# Patient Record
Sex: Female | Born: 1960
Health system: Southern US, Community
[De-identification: ages and names within clinical notes are randomized; demographics above are authoritative.]

## PROBLEM LIST (undated history)

## (undated) DIAGNOSIS — I10 Essential (primary) hypertension: Secondary | ICD-10-CM

## (undated) DIAGNOSIS — R87619 Unspecified abnormal cytological findings in specimens from cervix uteri: Secondary | ICD-10-CM

## (undated) DIAGNOSIS — F419 Anxiety disorder, unspecified: Secondary | ICD-10-CM

## (undated) DIAGNOSIS — D219 Benign neoplasm of connective and other soft tissue, unspecified: Secondary | ICD-10-CM

## (undated) DIAGNOSIS — IMO0002 Reserved for concepts with insufficient information to code with codable children: Secondary | ICD-10-CM

## (undated) DIAGNOSIS — E785 Hyperlipidemia, unspecified: Secondary | ICD-10-CM

## (undated) HISTORY — DX: Essential (primary) hypertension: I10

## (undated) HISTORY — DX: Anxiety disorder, unspecified: F41.9

## (undated) HISTORY — DX: Benign neoplasm of connective and other soft tissue, unspecified: D21.9

## (undated) HISTORY — PX: TONSILLECTOMY: SUR1361

## (undated) HISTORY — DX: Unspecified abnormal cytological findings in specimens from cervix uteri: R87.619

## (undated) HISTORY — DX: Reserved for concepts with insufficient information to code with codable children: IMO0002

## (undated) HISTORY — DX: Hyperlipidemia, unspecified: E78.5

---

## 1990-03-04 HISTORY — PX: DILATION AND CURETTAGE OF UTERUS: SHX78

## 1999-06-27 ENCOUNTER — Other Ambulatory Visit: Admission: RE | Admit: 1999-06-27 | Discharge: 1999-06-27 | Payer: Self-pay | Admitting: Family Medicine

## 1999-07-26 ENCOUNTER — Encounter (INDEPENDENT_AMBULATORY_CARE_PROVIDER_SITE_OTHER): Payer: Self-pay | Admitting: Specialist

## 1999-07-26 ENCOUNTER — Other Ambulatory Visit: Admission: RE | Admit: 1999-07-26 | Discharge: 1999-07-26 | Payer: Self-pay | Admitting: Obstetrics and Gynecology

## 2000-01-07 ENCOUNTER — Other Ambulatory Visit: Admission: RE | Admit: 2000-01-07 | Discharge: 2000-01-07 | Payer: Self-pay | Admitting: Obstetrics and Gynecology

## 2000-06-09 ENCOUNTER — Other Ambulatory Visit: Admission: RE | Admit: 2000-06-09 | Discharge: 2000-06-09 | Payer: Self-pay | Admitting: Obstetrics and Gynecology

## 2000-12-15 ENCOUNTER — Other Ambulatory Visit: Admission: RE | Admit: 2000-12-15 | Discharge: 2000-12-15 | Payer: Self-pay | Admitting: Obstetrics and Gynecology

## 2001-07-10 ENCOUNTER — Other Ambulatory Visit: Admission: RE | Admit: 2001-07-10 | Discharge: 2001-07-10 | Payer: Self-pay | Admitting: Obstetrics and Gynecology

## 2001-10-05 ENCOUNTER — Emergency Department (HOSPITAL_COMMUNITY): Admission: EM | Admit: 2001-10-05 | Discharge: 2001-10-05 | Payer: Self-pay | Admitting: Emergency Medicine

## 2002-08-06 ENCOUNTER — Other Ambulatory Visit: Admission: RE | Admit: 2002-08-06 | Discharge: 2002-08-06 | Payer: Self-pay | Admitting: Obstetrics and Gynecology

## 2003-08-09 ENCOUNTER — Other Ambulatory Visit: Admission: RE | Admit: 2003-08-09 | Discharge: 2003-08-09 | Payer: Self-pay | Admitting: Obstetrics and Gynecology

## 2004-03-16 ENCOUNTER — Other Ambulatory Visit: Admission: RE | Admit: 2004-03-16 | Discharge: 2004-03-16 | Payer: Self-pay | Admitting: Obstetrics and Gynecology

## 2004-09-03 ENCOUNTER — Other Ambulatory Visit: Admission: RE | Admit: 2004-09-03 | Discharge: 2004-09-03 | Payer: Self-pay | Admitting: Obstetrics and Gynecology

## 2005-09-09 ENCOUNTER — Other Ambulatory Visit: Admission: RE | Admit: 2005-09-09 | Discharge: 2005-09-09 | Payer: Self-pay | Admitting: Obstetrics & Gynecology

## 2005-09-18 ENCOUNTER — Encounter: Admission: RE | Admit: 2005-09-18 | Discharge: 2005-09-18 | Payer: Self-pay | Admitting: Obstetrics & Gynecology

## 2005-09-30 ENCOUNTER — Encounter: Admission: RE | Admit: 2005-09-30 | Discharge: 2005-09-30 | Payer: Self-pay | Admitting: Obstetrics & Gynecology

## 2006-04-11 ENCOUNTER — Other Ambulatory Visit: Admission: RE | Admit: 2006-04-11 | Discharge: 2006-04-11 | Payer: Self-pay | Admitting: Obstetrics & Gynecology

## 2006-09-26 ENCOUNTER — Other Ambulatory Visit: Admission: RE | Admit: 2006-09-26 | Discharge: 2006-09-26 | Payer: Self-pay | Admitting: Obstetrics & Gynecology

## 2007-11-18 ENCOUNTER — Other Ambulatory Visit: Admission: RE | Admit: 2007-11-18 | Discharge: 2007-11-18 | Payer: Self-pay | Admitting: Obstetrics & Gynecology

## 2009-12-22 ENCOUNTER — Ambulatory Visit (HOSPITAL_COMMUNITY): Admission: RE | Admit: 2009-12-22 | Discharge: 2009-12-22 | Payer: Self-pay | Admitting: Obstetrics & Gynecology

## 2010-02-01 HISTORY — PX: LEEP: SHX91

## 2010-03-25 ENCOUNTER — Encounter: Payer: Self-pay | Admitting: Obstetrics & Gynecology

## 2010-04-02 ENCOUNTER — Other Ambulatory Visit (HOSPITAL_COMMUNITY): Payer: Self-pay | Admitting: Maternal and Fetal Medicine

## 2010-04-02 DIAGNOSIS — O10919 Unspecified pre-existing hypertension complicating pregnancy, unspecified trimester: Secondary | ICD-10-CM

## 2010-04-03 ENCOUNTER — Other Ambulatory Visit (HOSPITAL_COMMUNITY): Payer: Self-pay | Admitting: Obstetrics and Gynecology

## 2010-04-03 ENCOUNTER — Other Ambulatory Visit (HOSPITAL_COMMUNITY): Payer: Self-pay | Admitting: Maternal and Fetal Medicine

## 2010-04-03 DIAGNOSIS — O09529 Supervision of elderly multigravida, unspecified trimester: Secondary | ICD-10-CM

## 2010-04-03 DIAGNOSIS — O2690 Pregnancy related conditions, unspecified, unspecified trimester: Secondary | ICD-10-CM

## 2012-06-23 ENCOUNTER — Telehealth: Payer: Self-pay | Admitting: *Deleted

## 2012-06-23 NOTE — Telephone Encounter (Signed)
Patient last annual was 04/22/11 requesting refill of Lisinopril-HCTZ 20-25 mg has a alert in chart to follow up for 6 month PAP originally scheduled for 10/16/11 ok to refill?

## 2012-06-23 NOTE — Telephone Encounter (Signed)
Can have refill for one month but must come in for Pap

## 2012-06-24 ENCOUNTER — Other Ambulatory Visit: Payer: Self-pay | Admitting: *Deleted

## 2012-06-24 MED ORDER — LISINOPRIL-HYDROCHLOROTHIAZIDE 20-25 MG PO TABS: 1.0000 | ORAL_TABLET | Freq: Every day | ORAL | Status: DC

## 2012-06-30 NOTE — Telephone Encounter (Signed)
This is fine 

## 2012-06-30 NOTE — Telephone Encounter (Signed)
Dr Hyacinth Meeker, I left her a message that we need to schedule her AEX/follow up pap  She is aware it is late but wanted to wait until you came back.. She is on the schedule for 07/31/12. Is this ok or do I need to try to get her in sooner?

## 2012-07-31 ENCOUNTER — Ambulatory Visit: Payer: Self-pay | Admitting: Obstetrics & Gynecology

## 2012-08-19 ENCOUNTER — Telehealth: Payer: Self-pay | Admitting: Obstetrics & Gynecology

## 2012-08-19 NOTE — Telephone Encounter (Signed)
Called patient to ask if she can come in sooner as she is overdue for her for her annual.

## 2012-08-19 NOTE — Telephone Encounter (Signed)
Patient is calling ask the Dr. If she will extend her Rx for her Blood Pressure meds. She has 5 left. Her appointment is not until Sept. 2, 2014 . Was last filled on April 23,0214

## 2012-08-20 NOTE — Telephone Encounter (Signed)
Patient appointment for AEX moved up//kn

## 2012-08-21 ENCOUNTER — Encounter: Payer: Self-pay | Admitting: Obstetrics & Gynecology

## 2012-08-24 ENCOUNTER — Ambulatory Visit (INDEPENDENT_AMBULATORY_CARE_PROVIDER_SITE_OTHER): Payer: 59 | Admitting: Obstetrics & Gynecology

## 2012-08-24 ENCOUNTER — Other Ambulatory Visit: Payer: Self-pay | Admitting: Obstetrics & Gynecology

## 2012-08-24 ENCOUNTER — Encounter: Payer: Self-pay | Admitting: Obstetrics & Gynecology

## 2012-08-24 VITALS — BP 146/82 | HR 60 | Resp 16 | Ht 65.25 in | Wt 161.8 lb

## 2012-08-24 DIAGNOSIS — Z1231 Encounter for screening mammogram for malignant neoplasm of breast: Secondary | ICD-10-CM

## 2012-08-24 DIAGNOSIS — Z124 Encounter for screening for malignant neoplasm of cervix: Secondary | ICD-10-CM

## 2012-08-24 DIAGNOSIS — Z0000 Encounter for general adult medical examination without abnormal findings: Secondary | ICD-10-CM

## 2012-08-24 DIAGNOSIS — Z01419 Encounter for gynecological examination (general) (routine) without abnormal findings: Secondary | ICD-10-CM

## 2012-08-24 LAB — POCT URINALYSIS DIPSTICK
Bilirubin, UA: NEGATIVE
Blood, UA: NEGATIVE
Glucose, UA: NEGATIVE
Ketones, UA: NEGATIVE
Nitrite, UA: NEGATIVE
pH, UA: 5

## 2012-08-24 LAB — LIPID PANEL
Cholesterol: 245 mg/dL — ABNORMAL HIGH (ref 0–200)
HDL: 58 mg/dL (ref >39)
LDL Cholesterol: 162 mg/dL — ABNORMAL HIGH (ref 0–99)
Total CHOL/HDL Ratio: 4.2 Ratio
Triglycerides: 125 mg/dL (ref <150)
VLDL: 25 mg/dL (ref 0–40)

## 2012-08-24 LAB — COMPREHENSIVE METABOLIC PANEL
AST: 20 U/L (ref 0–37)
Albumin: 4.7 g/dL (ref 3.5–5.2)
Alkaline Phosphatase: 63 U/L (ref 39–117)
BUN: 20 mg/dL (ref 6–23)
Creat: 0.84 mg/dL (ref 0.50–1.10)
Glucose, Bld: 87 mg/dL (ref 70–99)
Potassium: 3.9 mEq/L (ref 3.5–5.3)

## 2012-08-24 LAB — TSH: TSH: 1.467 u[IU]/mL (ref 0.350–4.500)

## 2012-08-24 MED ORDER — LISINOPRIL-HYDROCHLOROTHIAZIDE 20-25 MG PO TABS: 1.0000 | ORAL_TABLET | Freq: Every day | ORAL | Status: DC

## 2012-08-24 NOTE — Progress Notes (Signed)
52 y.o. G3P2 MarriedCaucasianF here for annual exam.  Daughter is now in [facility] and expecting second grandchild.  Oldest is 3--a boy and she is expecting a little girl.  Since IUD placement, patient having very little bleeding.  Has spotted twice since then.  Having night sweats.  PCP is retiring--Dr. Gerri Spore.  Lost ~15 pounds since last year.  Has been working on this.        Sexually active: yes  The current method of family planning is IUD, placed 04/23/11 Exercising: no  not regularly Smoker:  no  Health Maintenance: Pap:  04/22/11 WNL History of abnormal Pap:  Yes h/o CIN I, CIN II/LEEP 12/11 negative margins MMG:  12/22/09 normal  Colonoscopy:  none BMD:   none TDaP:  3/09 Screening Labs: today, Hb today: today, Urine today: WBC-2+, PROTEIN-trace   reports that she has never smoked. She has never used smokeless tobacco. She reports that she does not drink alcohol or use illicit drugs.  Past Medical History  Diagnosis Date  . Hypertension   . Abnormal Pap smear     h/o LEEP 12/11  . Fibroids     Past Surgical History  Procedure Laterality Date  . Tonsillectomy    . Dilation and curettage of uterus  1992    MAB  . Leep  12/11    negative margins    Current Outpatient Prescriptions  Medication Sig Dispense Refill  . levonorgestrel (MIRENA) 20 MCG/24HR IUD 1 each by Intrauterine route once.      Marland Kitchen lisinopril-hydrochlorothiazide (PRINZIDE,ZESTORETIC) 20-25 MG per tablet Take 1 tablet by mouth daily.  30 tablet  0   No current facility-administered medications for this visit.    Family History  Problem Relation Age of Onset  . Hypertension Mother   . Hypertension Father   . Hypertension Sister   . Diabetes Maternal Grandfather   . Heart attack Paternal Grandmother     and strokes  . Heart disease Paternal Grandfather     ROS:  Pertinent items are noted in HPI.  Otherwise, a comprehensive ROS was negative.  Exam:   BP 146/82  Pulse 60  Resp 16  Ht 5'  5.25" (1.657 m)  Wt 161 lb 12.8 oz (73.392 kg)  BMI 26.73 kg/m2  LMP 04/02/2012  Weight change: -15lbs Height: 5' 5.25" (165.7 cm)  Ht Readings from Last 3 Encounters:  08/24/12 5' 5.25" (1.657 m)    General appearance: alert, cooperative and appears stated age Head: Normocephalic, without obvious abnormality, atraumatic Neck: no adenopathy, supple, symmetrical, trachea midline and thyroid normal to inspection and palpation Lungs: clear to auscultation bilaterally Breasts: normal appearance, no masses or tenderness Heart: regular rate and rhythm Abdomen: soft, non-tender; bowel sounds normal; no masses,  no organomegaly Extremities: extremities normal, atraumatic, no cyanosis or edema Skin: Skin color, texture, turgor normal. No rashes or lesions Lymph nodes: Cervical, supraclavicular, and axillary nodes normal. No abnormal inguinal nodes palpated Neurologic: Grossly normal   Pelvic: External genitalia:  no lesions              Urethra:  normal appearing urethra with no masses, tenderness or lesions              Bartholins and Skenes: normal                 Vagina: normal appearing vagina with normal color and discharge, no lesions              Cervix: no lesions  and s/p LEEP, no IUD string noted.              Pap taken: yes Bimanual Exam:  Uterus:  normal size, contour, position, consistency, mobility, non-tender, much smaller              Adnexa: normal adnexa and no mass, fullness, tenderness               Rectovaginal: Confirms               Anus:  normal sphincter tone, no lesions  A:  Well Woman with normal exam H/O LEEP.  H/O CIN II with neg margins. H/O uterine fibroids and menorrhagia S/P Mirena placement 2/13.  No string seen but this was the case at 6 week f/u.  U/S showed normal placement.  P:   Mammogram due.  We will schedule for pt--July 2 @ 12:00 RF on Lisinopril-HCTZ to pharmacy FLP, CMP, Vit D, TSH pap smear yearly Referral for colnoscopy return annually  or prn  An After Visit Summary was printed and given to the patient.

## 2012-08-24 NOTE — Patient Instructions (Signed)

## 2012-08-25 ENCOUNTER — Telehealth: Payer: Self-pay | Admitting: Orthopedic Surgery

## 2012-08-25 ENCOUNTER — Encounter: Payer: Self-pay | Admitting: Internal Medicine

## 2012-08-25 LAB — HEMOGLOBIN, FINGERSTICK: Hemoglobin, fingerstick: 13 g/dL (ref 12.0–16.0)

## 2012-08-25 NOTE — Telephone Encounter (Signed)
LMTCB re: appt. aa    (Need to tell her about appt for colonoscopy consult with Dr. Diamond Nickel 10-16-12 at 9:00. Procedure appt 10-30-12 at 10:00. [facility] [address] Phone [phone] to resched.)

## 2012-08-27 LAB — IPS PAP TEST WITH REFLEX TO HPV

## 2012-09-02 ENCOUNTER — Ambulatory Visit (HOSPITAL_COMMUNITY)
Admission: RE | Admit: 2012-09-02 | Discharge: 2012-09-02 | Disposition: A | Discharge status: Home / Self Care | Payer: 59 | Source: Ambulatory Visit | Attending: Obstetrics & Gynecology | Admitting: Obstetrics & Gynecology

## 2012-09-02 DIAGNOSIS — Z1231 Encounter for screening mammogram for malignant neoplasm of breast: Secondary | ICD-10-CM | POA: Insufficient documentation

## 2012-09-03 ENCOUNTER — Ambulatory Visit: Payer: Self-pay | Admitting: Obstetrics & Gynecology

## 2012-10-16 ENCOUNTER — Ambulatory Visit (AMBULATORY_SURGERY_CENTER): Payer: 59 | Admitting: *Deleted

## 2012-10-16 VITALS — Ht 65.0 in | Wt 167.4 lb

## 2012-10-16 DIAGNOSIS — Z1211 Encounter for screening for malignant neoplasm of colon: Secondary | ICD-10-CM

## 2012-10-16 MED ORDER — MOVIPREP 100 G PO SOLR: ORAL | Status: DC

## 2012-10-16 NOTE — Progress Notes (Signed)
No allergies to eggs or soy. No problems with anesthesia.  

## 2012-10-30 ENCOUNTER — Ambulatory Visit (AMBULATORY_SURGERY_CENTER): Payer: 59 | Admitting: Internal Medicine

## 2012-10-30 ENCOUNTER — Encounter: Payer: Self-pay | Admitting: Internal Medicine

## 2012-10-30 VITALS — BP 110/71 | HR 66 | Temp 98.0°F | Resp 16 | Ht 65.0 in | Wt 167.0 lb

## 2012-10-30 DIAGNOSIS — D126 Benign neoplasm of colon, unspecified: Secondary | ICD-10-CM

## 2012-10-30 DIAGNOSIS — Z1211 Encounter for screening for malignant neoplasm of colon: Secondary | ICD-10-CM

## 2012-10-30 MED ORDER — SODIUM CHLORIDE 0.9 % IV SOLN: 500.0000 mL | INTRAVENOUS | Status: DC

## 2012-10-30 NOTE — Progress Notes (Signed)
Patient did not experience any of the following events: a burn prior to discharge; a fall within the facility; wrong site/side/patient/procedure/implant event; or a hospital transfer or hospital admission upon discharge from the facility. (G8907) Patient did not have preoperative order for IV antibiotic SSI prophylaxis. (G8918)  

## 2012-10-30 NOTE — Progress Notes (Signed)
Report to pacu rn, vss, bbs=clear 

## 2012-10-30 NOTE — Op Note (Signed)
[facility] [address] [place], [address]   COLONOSCOPY PROCEDURE REPORT  PATIENT: Darlene Shelton, Darlene Shelton  MR#: [id number] BIRTHDATE: Jul 28, 1960 , 51  yrs. old GENDER: Female ENDOSCOPIST: Hart Carwin, MD REFERRED [id number] Hyacinth Meeker, M.D. , [facility] PROCEDURE DATE:  10/30/2012 PROCEDURE:   Colonoscopy with snare polypectomy First Screening Colonoscopy - Avg.  risk and is 50 yrs.  old or older Yes.  Prior Negative Screening - Now for repeat screening. N/A  History of Adenoma - Now for follow-up colonoscopy & has been > or = to 3 yrs.  N/A  Polyps Removed Today? Yes. ASA CLASS:   Class I INDICATIONS:Average risk patient for colon cancer. MEDICATIONS: MAC sedation, administered by CRNA and propofol (Diprivan) 300mg  IV  DESCRIPTION OF PROCEDURE:   After the risks benefits and alternatives of the procedure were thoroughly explained, informed consent was obtained.  A digital rectal exam revealed no abnormalities of the rectum.   The LB [id number]  endoscope was introduced through the anus and advanced to the cecum, which was identified by both the appendix and ileocecal valve. No adverse events experienced.   The quality of the prep was excellent, using MoviPrep  The instrument was then slowly withdrawn as the colon was fully examined.      COLON FINDINGS: A polypoid shaped sessile polyp ranging between 3-93mm in size was found in the sigmoid colon.  A polypectomy was performed with a cold snare.  The resection was complete and the polyp tissue was completely retrieved.  Retroflexed views revealed no abnormalities. The time to cecum=9 minutes 16 seconds. Withdrawal time=8 minutes 26 seconds.  The scope was withdrawn and the procedure completed. COMPLICATIONS: There were no complications.  ENDOSCOPIC IMPRESSION: Sessile polyp ranging between 3-22mm in size was found in the sigmoid colon; polypectomy was performed with a cold  snare  RECOMMENDATIONS: Await pathology results high fiber diet recall pending path report   eSigned:  Hart Carwin, MD 10/30/2012 12:09 PM   cc:

## 2012-10-30 NOTE — Patient Instructions (Addendum)
1 polyp removed and sent to pathology.  Avoid aspirin or antiinflammatory products for 2 weeks (until 11/13/2012) High fiber diet recommended. Await pathology for final recommendations.  YOU HAD AN ENDOSCOPIC PROCEDURE TODAY AT THE [facility]: Refer to the procedure report that was given to you for any specific questions about what was found during the examination.  If the procedure report does not answer your questions, please call your gastroenterologist to clarify.  If you requested that your care partner not be given the details of your procedure findings, then the procedure report has been included in a sealed envelope for you to review at your convenience later.  YOU SHOULD EXPECT: Some feelings of bloating in the abdomen. Passage of more gas than usual.  Walking can help get rid of the air that was put into your GI tract during the procedure and reduce the bloating. If you had a lower endoscopy (such as a colonoscopy or flexible sigmoidoscopy) you may notice spotting of blood in your stool or on the toilet paper. If you underwent a bowel prep for your procedure, then you may not have a normal bowel movement for a few days.  DIET: Your first meal following the procedure should be a light meal and then it is ok to progress to your normal diet.  A half-sandwich or bowl of soup is an example of a good first meal.  Heavy or fried foods are harder to digest and may make you feel nauseous or bloated.  Likewise meals heavy in dairy and vegetables can cause extra gas to form and this can also increase the bloating.  Drink plenty of fluids but you should avoid alcoholic beverages for 24 hours.  ACTIVITY: Your care partner should take you home directly after the procedure.  You should plan to take it easy, moving slowly for the rest of the day.  You can resume normal activity the day after the procedure however you should NOT DRIVE or use heavy machinery for 24 hours (because of the sedation  medicines used during the test).    SYMPTOMS TO REPORT IMMEDIATELY: A gastroenterologist can be reached at any hour.  During normal business hours, 8:30 AM to 5:00 PM Monday through Friday, call [phone].  After hours and on weekends, please call the GI answering service at [phone] who will take a message and have the physician on call contact you.   Following lower endoscopy (colonoscopy or flexible sigmoidoscopy):  Excessive amounts of blood in the stool  Significant tenderness or worsening of abdominal pains  Swelling of the abdomen that is new, acute  Fever of 100F or higher  FOLLOW UP: If any biopsies were taken you will be contacted by phone or by letter within the next 1-3 weeks.  Call your gastroenterologist if you have not heard about the biopsies in 3 weeks.  Our staff will call the home number listed on your records the next business day following your procedure to check on you and address any questions or concerns that you may have at that time regarding the information given to you following your procedure. This is a courtesy call and so if there is no answer at the home number and we have not heard from you through the emergency physician on call, we will assume that you have returned to your regular daily activities without incident.  SIGNATURES/CONFIDENTIALITY: You and/or your care partner have signed paperwork which will be entered into your electronic medical record.  These signatures  attest to the fact that that the information above on your After Visit Summary has been reviewed and is understood.  Full responsibility of the confidentiality of this discharge information lies with you and/or your care-partner.

## 2012-10-30 NOTE — Progress Notes (Signed)
Called to room to assist during endoscopic procedure.  Patient ID and intended procedure confirmed with present staff. Received instructions for my participation in the procedure from the performing physician.  

## 2012-11-03 ENCOUNTER — Telehealth: Payer: Self-pay

## 2012-11-03 ENCOUNTER — Ambulatory Visit: Payer: Self-pay | Admitting: Obstetrics & Gynecology

## 2012-11-03 NOTE — Telephone Encounter (Signed)
Left message on answering machine. 

## 2012-11-05 ENCOUNTER — Encounter: Payer: Self-pay | Admitting: Internal Medicine

## 2013-01-07 ENCOUNTER — Other Ambulatory Visit: Payer: Self-pay

## 2013-07-14 ENCOUNTER — Encounter: Payer: Self-pay | Admitting: Obstetrics & Gynecology

## 2013-09-08 ENCOUNTER — Ambulatory Visit: Payer: 59 | Admitting: Obstetrics & Gynecology

## 2013-09-24 ENCOUNTER — Ambulatory Visit: Payer: 59 | Admitting: Obstetrics & Gynecology

## 2013-10-04 ENCOUNTER — Ambulatory Visit (INDEPENDENT_AMBULATORY_CARE_PROVIDER_SITE_OTHER): Payer: 59 | Admitting: Obstetrics & Gynecology

## 2013-10-04 ENCOUNTER — Encounter: Payer: Self-pay | Admitting: Obstetrics & Gynecology

## 2013-10-04 VITALS — BP 122/78 | HR 68 | Resp 16 | Ht 65.0 in | Wt 169.4 lb

## 2013-10-04 DIAGNOSIS — Z01419 Encounter for gynecological examination (general) (routine) without abnormal findings: Secondary | ICD-10-CM

## 2013-10-04 DIAGNOSIS — Z0000 Encounter for general adult medical examination without abnormal findings: Secondary | ICD-10-CM

## 2013-10-04 LAB — POCT URINALYSIS DIPSTICK
Bilirubin, UA: NEGATIVE
GLUCOSE UA: NEGATIVE
Nitrite, UA: NEGATIVE
PROTEIN UA: NEGATIVE
RBC UA: NEGATIVE
UROBILINOGEN UA: NEGATIVE
pH, UA: 5

## 2013-10-04 MED ORDER — LISINOPRIL-HYDROCHLOROTHIAZIDE 20-25 MG PO TABS: 1.0000 | ORAL_TABLET | Freq: Every day | ORAL | Status: DC

## 2013-10-04 NOTE — Progress Notes (Signed)
53 y.o. G3P2 MarriedCaucasianF here for annual exam.  Has two grandchildren in [place].  Daughter lives there.  Pt goes there frequently to visit.  Denies VB.     Patient's last menstrual period was 04/05/2011.          Sexually active: Yes.    The current method of family planning is IUD.  Placed 2/13. Exercising: Yes.    walking Smoker:  no  Health Maintenance: Pap:  08/24/12 ASCUS/negative HR HPV History of abnormal Pap:  yes MMG:  09/02/12-normal Colonoscopy:  2014-repeat in 10 years, one polyp BMD:   none TDaP:  3/09 Screening Labs: today, Hb today: today, Urine today: WBC-1+, KETONE-trace   reports that she has never smoked. She has never used smokeless tobacco. She reports that she does not drink alcohol or use illicit drugs.  Past Medical History  Diagnosis Date  . Hypertension   . Abnormal Pap smear     h/o LEEP 12/11  . Fibroids     Past Surgical History  Procedure Laterality Date  . Tonsillectomy    . Dilation and curettage of uterus  1992    MAB  . Leep  12/11    negative margins    Current Outpatient Prescriptions  Medication Sig Dispense Refill  . levonorgestrel (MIRENA) 20 MCG/24HR IUD 1 each by Intrauterine route once.      Marland Kitchen lisinopril-hydrochlorothiazide (PRINZIDE,ZESTORETIC) 20-25 MG per tablet Take 1 tablet by mouth daily.  90 tablet  4   No current facility-administered medications for this visit.    Family History  Problem Relation Age of Onset  . Hypertension Mother   . Hypertension Father   . Hypertension Sister   . Diabetes Maternal Grandfather   . Heart attack Paternal Grandmother     and strokes  . Heart disease Paternal Grandfather   . Colon cancer Neg Hx     ROS:  Pertinent items are noted in HPI.  Otherwise, a comprehensive ROS was negative.  Exam:   BP 122/78  Pulse 68  Resp 16  Ht 5\' 5"  (1.651 m)  Wt 169 lb 6.4 oz (76.839 kg)  BMI 28.19 kg/m2  LMP 04/05/2011  Weight change: +8#  Height: 5\' 5"  (165.1 cm)  Ht Readings  from Last 3 Encounters:  10/04/13 5\' 5"  (1.651 m)  10/30/12 5\' 5"  (1.651 m)  10/16/12 5\' 5"  (1.651 m)    General appearance: alert, cooperative and appears stated age Head: Normocephalic, without obvious abnormality, atraumatic Neck: no adenopathy, supple, symmetrical, trachea midline and thyroid normal to inspection and palpation Lungs: clear to auscultation bilaterally Breasts: normal appearance, no masses or tenderness Heart: regular rate and rhythm Abdomen: soft, non-tender; bowel sounds normal; no masses,  no organomegaly Extremities: extremities normal, atraumatic, no cyanosis or edema Skin: Skin color, texture, turgor normal. No rashes or lesions Lymph nodes: Cervical, supraclavicular, and axillary nodes normal. No abnormal inguinal nodes palpated Neurologic: Grossly normal   Pelvic: External genitalia:  no lesions              Urethra:  normal appearing urethra with no masses, tenderness or lesions              Bartholins and Skenes: normal                 Vagina: normal appearing vagina with normal color and discharge, no lesions              Cervix: no lesions  Pap taken: No. Bimanual Exam:  Uterus:  normal size, contour, position, consistency, mobility, non-tender              Adnexa: normal adnexa and no mass, fullness, tenderness               Rectovaginal: Confirms               Anus:  normal sphincter tone, no lesions  A:  Well Woman with normal exam  H/O LEEP. H/O CIN II on biopsy but LEEP with no dysplasia.  H/O uterine fibroids and menorrhagia  S/P Mirena placement 2/13. No string seen but this was the case at 6 week f/u. U/S showed normal placement.   P: Mammogram due. Pt reports she will schedule. RF on Lisinopril-HCTZ to pharmacy  FLP, CMP, Vit D, TSH today ASCUS pap with neg HR HPV 2014.  No pap today.   return annually or prn  An After Visit Summary was printed and given to the patient.

## 2013-10-05 LAB — COMPREHENSIVE METABOLIC PANEL
ALBUMIN: 5.2 g/dL (ref 3.5–5.2)
ALK PHOS: 65 U/L (ref 39–117)
ALT: 12 U/L (ref 0–35)
AST: 20 U/L (ref 0–37)
BUN: 21 mg/dL (ref 6–23)
CO2: 27 meq/L (ref 19–32)
Calcium: 10.2 mg/dL (ref 8.4–10.5)
Chloride: 98 mEq/L (ref 96–112)
Creat: 0.86 mg/dL (ref 0.50–1.10)
GLUCOSE: 86 mg/dL (ref 70–99)
POTASSIUM: 3.6 meq/L (ref 3.5–5.3)
SODIUM: 136 meq/L (ref 135–145)
TOTAL PROTEIN: 7.6 g/dL (ref 6.0–8.3)
Total Bilirubin: 0.8 mg/dL (ref 0.2–1.2)

## 2013-10-05 LAB — LIPID PANEL
Cholesterol: 262 mg/dL — ABNORMAL HIGH (ref 0–200)
HDL: 57 mg/dL (ref >39)
LDL Cholesterol: 184 mg/dL — ABNORMAL HIGH (ref 0–99)
Total CHOL/HDL Ratio: 4.6 Ratio
Triglycerides: 106 mg/dL (ref <150)
VLDL: 21 mg/dL (ref 0–40)

## 2013-10-05 LAB — TSH: TSH: 1.836 u[IU]/mL (ref 0.350–4.500)

## 2013-10-05 LAB — VITAMIN D 25 HYDROXY (VIT D DEFICIENCY, FRACTURES): Vit D, 25-Hydroxy: 36 ng/mL (ref 30–89)

## 2013-10-28 ENCOUNTER — Ambulatory Visit: Payer: 59 | Admitting: Obstetrics & Gynecology

## 2013-11-16 ENCOUNTER — Telehealth: Payer: Self-pay

## 2013-11-16 NOTE — Telephone Encounter (Signed)
Message copied by Robley Fries on Tue Nov 16, 2013 10:02 AM ------      Message from: Megan Salon      Created: Wed Oct 06, 2013 12:04 PM       Inform CMP nl.  TSH nl.  Vit D good.  Cholesterol elevated.  LDLs now in 180's.  Really needs to see PCP to discuss treatment.  She doesn't have one.  Can we refer her? ------

## 2013-11-16 NOTE — Telephone Encounter (Signed)
Left patient detailed message-ok per DPR-with results. Informed to call back if she would like [place] to refer her to a PCP. I have not heard back from patient, is it ok to remove from my box or do I need to send a letter? Please advise.//kn

## 2013-11-17 NOTE — Telephone Encounter (Signed)
Please send letter and then can remove from inbox.  Thanks.

## 2013-11-25 NOTE — Telephone Encounter (Signed)
Letter has been mailed with information.//kn

## 2014-01-03 ENCOUNTER — Encounter: Payer: Self-pay | Admitting: Obstetrics & Gynecology

## 2014-10-14 ENCOUNTER — Ambulatory Visit (INDEPENDENT_AMBULATORY_CARE_PROVIDER_SITE_OTHER): Payer: 59 | Admitting: Obstetrics & Gynecology

## 2014-10-14 ENCOUNTER — Encounter: Payer: Self-pay | Admitting: Obstetrics & Gynecology

## 2014-10-14 VITALS — BP 122/82 | HR 64 | Resp 16 | Ht 64.75 in | Wt 181.0 lb

## 2014-10-14 DIAGNOSIS — E7800 Pure hypercholesterolemia, unspecified: Secondary | ICD-10-CM

## 2014-10-14 DIAGNOSIS — Z975 Presence of (intrauterine) contraceptive device: Secondary | ICD-10-CM | POA: Insufficient documentation

## 2014-10-14 DIAGNOSIS — I10 Essential (primary) hypertension: Secondary | ICD-10-CM

## 2014-10-14 DIAGNOSIS — Z0000 Encounter for general adult medical examination without abnormal findings: Secondary | ICD-10-CM

## 2014-10-14 DIAGNOSIS — Z1239 Encounter for other screening for malignant neoplasm of breast: Secondary | ICD-10-CM

## 2014-10-14 DIAGNOSIS — Z01419 Encounter for gynecological examination (general) (routine) without abnormal findings: Secondary | ICD-10-CM | POA: Diagnosis not present

## 2014-10-14 DIAGNOSIS — Z124 Encounter for screening for malignant neoplasm of cervix: Secondary | ICD-10-CM

## 2014-10-14 LAB — POCT URINALYSIS DIPSTICK
BILIRUBIN UA: NEGATIVE
Blood, UA: NEGATIVE
Glucose, UA: NEGATIVE
KETONES UA: NEGATIVE
Nitrite, UA: NEGATIVE
PROTEIN UA: NEGATIVE
Urobilinogen, UA: NEGATIVE
pH, UA: 5

## 2014-10-14 NOTE — Progress Notes (Signed)
54 y.o. G3P2 MarriedCaucasianF here for annual exam.  Doing well.  Reports work is very busy.  Denies vaginal bleeding.    PCP:  Dr. Ernie Hew.  Last labs were in April.  Has follow up scheduled this month.    Patient's last menstrual period was 03/05/2011.          Sexually active: Yes.    The current method of family planning is IUD.    Exercising: No.  not regularly Smoker:  no  Health Maintenance: Pap:  08/24/12 ASCUS/negative HR HPV History of abnormal Pap:  Yes h/o LEEP 12/11, h/o CIN II on biopsy, but LEEP with no dysplasia MMG:  09/02/12 BiRads 1-normal Colonoscopy:  10/30/12, small polyp (but final path was not a polyp just mucosal tissue).  Follow up 10 years.  BMD:   none TDaP:  3/09 Screening Labs: Dr. Ernie Hew, Hb today: not done, Urine today: WBC-trace   reports that she has never smoked. She has never used smokeless tobacco. She reports that she does not drink alcohol or use illicit drugs.  Past Medical History  Diagnosis Date  . Hypertension   . Abnormal Pap smear     h/o LEEP 12/11  . Fibroids     Past Surgical History  Procedure Laterality Date  . Tonsillectomy    . Dilation and curettage of uterus  1992    MAB  . Leep  12/11    negative margins    Current Outpatient Prescriptions  Medication Sig Dispense Refill  . atorvastatin (LIPITOR) 10 MG tablet Take 10 mg by mouth daily.    . hydrochlorothiazide (MICROZIDE) 12.5 MG capsule Take 12.5 mg by mouth daily.    Marland Kitchen levonorgestrel (MIRENA) 20 MCG/24HR IUD 1 each by Intrauterine route once.    Marland Kitchen losartan (COZAAR) 100 MG tablet Take 100 mg by mouth daily.     No current facility-administered medications for this visit.    Family History  Problem Relation Age of Onset  . Hypertension Mother   . Hypertension Father   . Hypertension Sister   . Diabetes Maternal Grandfather   . Heart attack Paternal Grandmother     and strokes  . Heart disease Paternal Grandfather   . Colon cancer Neg Hx     ROS:  Pertinent  items are noted in HPI.  Otherwise, a comprehensive ROS was negative.  Exam:   BP 122/82 mmHg  Pulse 64  Resp 16  Ht 5' 4.75" (1.645 m)  Wt 181 lb (82.101 kg)  BMI 30.34 kg/m2  LMP 03/05/2011  Weight change: +12#  Height: 5' 4.75" (164.5 cm)  Ht Readings from Last 3 Encounters:  10/14/14 5' 4.75" (1.645 m)  10/04/13 5\' 5"  (1.651 m)  10/30/12 5\' 5"  (1.651 m)    General appearance: alert, cooperative and appears stated age Head: Normocephalic, without obvious abnormality, atraumatic Neck: no adenopathy, supple, symmetrical, trachea midline and thyroid normal to inspection and palpation Lungs: clear to auscultation bilaterally Breasts: normal appearance, no masses or tenderness Heart: regular rate and rhythm Abdomen: soft, non-tender; bowel sounds normal; no masses,  no organomegaly Extremities: extremities normal, atraumatic, no cyanosis or edema Skin: Skin color, texture, turgor normal. No rashes or lesions Lymph nodes: Cervical, supraclavicular, and axillary nodes normal. No abnormal inguinal nodes palpated Neurologic: Grossly normal   Pelvic: External genitalia:  no lesions              Urethra:  normal appearing urethra with no masses, tenderness or lesions  Bartholins and Skenes: normal                 Vagina: normal appearing vagina with normal color and discharge, no lesions              Cervix: no lesions, cannot see IUD string              Pap taken: Yes.   Bimanual Exam:  Uterus:  normal size, contour, position, consistency, mobility, non-tender              Adnexa: normal adnexa and no mass, fullness, tenderness               Rectovaginal: Confirms               Anus:  normal sphincter tone, no lesions  A:  Well Woman with normal exam  H/O LEEP. H/O CIN II on biopsy but LEEP with no dysplasia.  H/O uterine fibroids and menorrhagia  S/P Mirena placement 2/13. Cannot see string but has been this way since 6 week follow up after placement.  Did have  PUS showing normal placement.  Hypertension Elevated lipids  P: Mammogram due.  Appt scheduled for pt today Labs done with Dr. Ernie Hew.  Has follow up at end of month. Pap today. Will check [place] next year.  If elevated, plan to remove IUD as following AEX.  If low, will replace IUD when due.  return annually or prn

## 2014-10-19 LAB — IPS PAP TEST WITH REFLEX TO HPV

## 2014-10-25 ENCOUNTER — Telehealth: Payer: Self-pay

## 2014-10-25 NOTE — Telephone Encounter (Signed)
-----   Message from Megan Salon, MD sent at 10/24/2014  4:23 PM EDT ----- Inform pt pap showed ASCUS but neg HR HPV.  Essentially this is negative.  Repeat 1 year.  08 recall.

## 2014-10-25 NOTE — Telephone Encounter (Signed)
Lmtcb//kn 

## 2014-10-27 ENCOUNTER — Ambulatory Visit (HOSPITAL_COMMUNITY)
Admission: RE | Admit: 2014-10-27 | Discharge: 2014-10-27 | Disposition: A | Discharge status: Home / Self Care | Payer: 59 | Source: Ambulatory Visit | Attending: Obstetrics & Gynecology | Admitting: Obstetrics & Gynecology

## 2014-10-27 DIAGNOSIS — Z1231 Encounter for screening mammogram for malignant neoplasm of breast: Secondary | ICD-10-CM | POA: Diagnosis not present

## 2014-10-27 DIAGNOSIS — Z1239 Encounter for other screening for malignant neoplasm of breast: Secondary | ICD-10-CM

## 2014-10-31 NOTE — Telephone Encounter (Signed)
Patient notified of results. See result note.//kn

## 2015-03-15 MED FILL — LOSARTAN-HCTZ 100-12.5 MG T: 100-12.5 | 90 days supply | Qty: 90 | Fill #0

## 2015-04-21 DIAGNOSIS — Z0000 Encounter for general adult medical examination without abnormal findings: Secondary | ICD-10-CM | POA: Diagnosis not present

## 2015-04-21 DIAGNOSIS — Z139 Encounter for screening, unspecified: Secondary | ICD-10-CM | POA: Diagnosis not present

## 2015-04-24 DIAGNOSIS — Z0000 Encounter for general adult medical examination without abnormal findings: Secondary | ICD-10-CM | POA: Diagnosis not present

## 2015-07-12 MED FILL — LOSARTAN-HCTZ 100-12.5 MG T: 100-12.5 | 90 days supply | Qty: 90 | Fill #1

## 2015-10-25 MED FILL — LOSARTAN-HCTZ 100-12.5 MG T: 100-12.5 | 90 days supply | Qty: 90 | Fill #2

## 2015-10-30 DIAGNOSIS — I10 Essential (primary) hypertension: Secondary | ICD-10-CM | POA: Diagnosis not present

## 2015-10-30 DIAGNOSIS — H6121 Impacted cerumen, right ear: Secondary | ICD-10-CM | POA: Diagnosis not present

## 2015-10-30 DIAGNOSIS — Z23 Encounter for immunization: Secondary | ICD-10-CM | POA: Diagnosis not present

## 2015-10-30 DIAGNOSIS — M722 Plantar fascial fibromatosis: Secondary | ICD-10-CM | POA: Diagnosis not present

## 2015-10-30 DIAGNOSIS — E782 Mixed hyperlipidemia: Secondary | ICD-10-CM | POA: Diagnosis not present

## 2015-10-30 DIAGNOSIS — J309 Allergic rhinitis, unspecified: Secondary | ICD-10-CM | POA: Diagnosis not present

## 2015-10-31 MED FILL — ROSUVASTATIN CALCIUM 5 MG T: 5 | 90 days supply | Qty: 90 | Fill #0

## 2015-11-20 DIAGNOSIS — H5202 Hypermetropia, left eye: Secondary | ICD-10-CM | POA: Diagnosis not present

## 2015-11-20 DIAGNOSIS — H524 Presbyopia: Secondary | ICD-10-CM | POA: Diagnosis not present

## 2015-11-20 DIAGNOSIS — H5211 Myopia, right eye: Secondary | ICD-10-CM | POA: Diagnosis not present

## 2015-11-20 DIAGNOSIS — H52223 Regular astigmatism, bilateral: Secondary | ICD-10-CM | POA: Diagnosis not present

## 2016-01-05 ENCOUNTER — Encounter: Payer: Self-pay | Admitting: Obstetrics & Gynecology

## 2016-01-05 ENCOUNTER — Ambulatory Visit (INDEPENDENT_AMBULATORY_CARE_PROVIDER_SITE_OTHER): Payer: 59 | Admitting: Obstetrics & Gynecology

## 2016-01-05 VITALS — BP 134/86 | HR 80 | Ht 65.0 in | Wt 175.0 lb

## 2016-01-05 DIAGNOSIS — N912 Amenorrhea, unspecified: Secondary | ICD-10-CM

## 2016-01-05 DIAGNOSIS — Z01419 Encounter for gynecological examination (general) (routine) without abnormal findings: Secondary | ICD-10-CM

## 2016-01-05 NOTE — Progress Notes (Signed)
55 y.o. G3P2 MarriedCaucasianF here for annual exam.  Doing well.  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  She just got back from [place] to see an office where this transition has occurred.  Was a long day.  Denies vaginal bleeding.  Patient's last menstrual period was 03/05/2011 (approximate).          Sexually active: Yes.    The current method of family planning is IUD.   Mirena placed in 04/2011 Exercising: Yes.    Home exercise routine includes jogging 3-4 times per week. Smoker:  no  Health Maintenance: Pap:  10/14/14 pap showed ASCUS but neg HR HPV History of abnormal Pap:  yes MMG:  10/27/14 BIRADS 1 negative Colonoscopy:  10/30/12, small polyp (but final path was not a polyp just mucosal tissue).  Follow up 10 years.  BMD:   none TDaP:  05/2007 Hep C testing: 11/2015 with PCP Screening Labs: Dr. Ernie Hew, Hb today: not done, Urine today: not done   reports that she has never smoked. She has never used smokeless tobacco. She reports that she does not drink alcohol or use drugs.  Past Medical History:  Diagnosis Date  . Abnormal Pap smear    h/o LEEP 12/11  . Fibroids   . Hypertension     Past Surgical History:  Procedure Laterality Date  . [place]   MAB  . LEEP  12/11   negative margins  . TONSILLECTOMY      Current Outpatient Prescriptions  Medication Sig Dispense Refill  . hydrochlorothiazide (MICROZIDE) 12.5 MG capsule Take 12.5 mg by mouth daily.    Marland Kitchen levonorgestrel (MIRENA) 20 MCG/24HR IUD 1 each by Intrauterine route once.    Marland Kitchen losartan (COZAAR) 100 MG tablet Take 100 mg by mouth daily.    . rosuvastatin (CRESTOR) 5 MG tablet Take 5 mg by mouth daily.     No current facility-administered medications for this visit.     Family History  Problem Relation Age of Onset  . Hypertension Mother   . Hypertension Father   . Hypertension Sister   . Diabetes Maternal Grandfather   . Heart attack  Paternal Grandmother     and strokes  . Heart disease Paternal Grandfather   . Colon cancer Neg Hx     ROS:  Pertinent items are noted in HPI.  Otherwise, a comprehensive ROS was negative.  Exam:   BP 134/86 (BP Location: Right Arm, Patient Position: Sitting, Cuff Size: Normal)   Pulse 80   Ht 5\' 5"  (1.651 m)   Wt 175 lb (79.4 kg)   LMP 03/05/2011 (Approximate)   BMI 29.12 kg/m   Weight change: +6#  Height: 5\' 5"  (165.1 cm)  Ht Readings from Last 3 Encounters:  01/05/16 5\' 5"  (1.651 m)  10/14/14 5' 4.75" (1.645 m)  10/04/13 5\' 5"  (1.651 m)   General appearance: alert, cooperative and appears stated age Head: Normocephalic, without obvious abnormality, atraumatic Neck: no adenopathy, supple, symmetrical, trachea midline and thyroid normal to inspection and palpation Lungs: clear to auscultation bilaterally Breasts: normal appearance, no masses or tenderness Heart: regular rate and rhythm Abdomen: soft, non-tender; bowel sounds normal; no masses,  no organomegaly Extremities: extremities normal, atraumatic, no cyanosis or edema Skin: Skin color, texture, turgor normal. No rashes or lesions Lymph nodes: Cervical, supraclavicular, and axillary nodes normal. No abnormal inguinal nodes palpated Neurologic: Grossly normal  Pelvic: External genitalia:  no lesions  Urethra:  normal appearing urethra with no masses, tenderness or lesions              Bartholins and Skenes: normal                 Vagina: normal appearing vagina with normal color and discharge, no lesions              Cervix: no lesions, IUD string not noted              Pap taken: No. Bimanual Exam:  Uterus:  normal size, contour, position, consistency, mobility, non-tender              Adnexa: normal adnexa and no mass, fullness, tenderness               Rectovaginal: Confirms               Anus:  normal sphincter tone, no lesions  Chaperone was present for exam.  A:  Normal well woman exam H/O LEEP  with h/o CIN 1/2 H/O uterine fibroids and menorrhagia  S/P Mirena placement 2/13. Cannot see string but has been this way since 6 week follow up after placement.  (has undergone PUS to show normal placement). Hypertension H/O hyperlipidemia  P:  Mammogram is due.  Pt knows and states she will schedule Pap with ASCUS and neg HR HPV 2016.  No pap today. [place] today.  If elevated, will plan IUD removal with next visit. Labs/vaccines done with Dr. Ernie Hew. return annually or prn

## 2016-01-06 LAB — FOLLICLE STIMULATING HORMONE: FSH: 114 m[IU]/mL

## 2016-02-05 ENCOUNTER — Telehealth: Payer: Self-pay | Admitting: *Deleted

## 2016-02-05 NOTE — Telephone Encounter (Signed)
Patient in 08 recall 01/2016. Patient seen for AEX 01-05-16. No PAP collect. Please advise on recall status -eh

## 2016-02-19 MED FILL — LOSARTAN-HCTZ 100-12.5 MG T: 100-12.5 | 90 days supply | Qty: 90 | Fill #0

## 2016-02-19 MED FILL — ROSUVASTATIN CALCIUM 5 MG T: 5 | 90 days supply | Qty: 90 | Fill #1

## 2016-04-01 NOTE — Telephone Encounter (Signed)
Ok to change to 08 recall for 11/18.

## 2016-04-01 NOTE — Telephone Encounter (Signed)
08 recall for 01/2017-eh

## 2016-04-29 DIAGNOSIS — Z0000 Encounter for general adult medical examination without abnormal findings: Secondary | ICD-10-CM | POA: Diagnosis not present

## 2016-05-01 ENCOUNTER — Other Ambulatory Visit: Payer: Self-pay | Admitting: Family Medicine

## 2016-05-01 DIAGNOSIS — Z0000 Encounter for general adult medical examination without abnormal findings: Secondary | ICD-10-CM | POA: Diagnosis not present

## 2016-05-01 DIAGNOSIS — Z6829 Body mass index (BMI) 29.0-29.9, adult: Secondary | ICD-10-CM | POA: Diagnosis not present

## 2016-05-01 DIAGNOSIS — Z1231 Encounter for screening mammogram for malignant neoplasm of breast: Secondary | ICD-10-CM

## 2016-05-01 DIAGNOSIS — Z1211 Encounter for screening for malignant neoplasm of colon: Secondary | ICD-10-CM | POA: Diagnosis not present

## 2016-05-16 ENCOUNTER — Ambulatory Visit
Admission: RE | Admit: 2016-05-16 | Discharge: 2016-05-16 | Disposition: A | Discharge status: Home / Self Care | Payer: 59 | Source: Ambulatory Visit | Attending: Family Medicine | Admitting: Family Medicine

## 2016-05-16 ENCOUNTER — Ambulatory Visit: Payer: 59

## 2016-05-16 DIAGNOSIS — Z1231 Encounter for screening mammogram for malignant neoplasm of breast: Secondary | ICD-10-CM | POA: Diagnosis not present

## 2016-06-28 MED FILL — ROSUVASTATIN CALCIUM 5 MG T: 5 | 90 days supply | Qty: 90 | Fill #0

## 2016-06-28 MED FILL — LOSARTAN-HCTZ 100-12.5 MG T: 100-12.5 | 90 days supply | Qty: 90 | Fill #1

## 2016-11-14 MED FILL — ROSUVASTATIN CALCIUM 5 MG T: 5 | 90 days supply | Qty: 90 | Fill #1

## 2016-11-14 MED FILL — LOSARTAN-HCTZ 100-12.5 MG T: 100-12.5 | 90 days supply | Qty: 90 | Fill #0

## 2016-11-19 DIAGNOSIS — I10 Essential (primary) hypertension: Secondary | ICD-10-CM | POA: Diagnosis not present

## 2016-11-19 DIAGNOSIS — E782 Mixed hyperlipidemia: Secondary | ICD-10-CM | POA: Diagnosis not present

## 2016-11-19 DIAGNOSIS — F341 Dysthymic disorder: Secondary | ICD-10-CM | POA: Diagnosis not present

## 2017-01-06 ENCOUNTER — Ambulatory Visit: Payer: 59 | Admitting: Obstetrics & Gynecology

## 2017-02-27 ENCOUNTER — Ambulatory Visit (INDEPENDENT_AMBULATORY_CARE_PROVIDER_SITE_OTHER): Payer: 59 | Admitting: Obstetrics & Gynecology

## 2017-02-27 ENCOUNTER — Other Ambulatory Visit: Payer: Self-pay

## 2017-02-27 ENCOUNTER — Other Ambulatory Visit (HOSPITAL_COMMUNITY)
Admission: RE | Admit: 2017-02-27 | Discharge: 2017-02-27 | Disposition: A | Discharge status: Home / Self Care | Payer: 59 | Source: Ambulatory Visit | Attending: Obstetrics & Gynecology | Admitting: Obstetrics & Gynecology

## 2017-02-27 ENCOUNTER — Encounter: Payer: Self-pay | Admitting: Obstetrics & Gynecology

## 2017-02-27 VITALS — BP 116/70 | HR 80 | Resp 16 | Ht 65.25 in | Wt 187.0 lb

## 2017-02-27 DIAGNOSIS — Z124 Encounter for screening for malignant neoplasm of cervix: Secondary | ICD-10-CM

## 2017-02-27 DIAGNOSIS — E785 Hyperlipidemia, unspecified: Secondary | ICD-10-CM | POA: Insufficient documentation

## 2017-02-27 DIAGNOSIS — Z01419 Encounter for gynecological examination (general) (routine) without abnormal findings: Secondary | ICD-10-CM | POA: Diagnosis not present

## 2017-02-27 DIAGNOSIS — Z30432 Encounter for removal of intrauterine contraceptive device: Secondary | ICD-10-CM | POA: Diagnosis not present

## 2017-02-27 MED ORDER — MISOPROSTOL 200 MCG PO TABS: ORAL_TABLET | ORAL | 0 refills | Status: DC

## 2017-02-27 MED FILL — miSOPROStol 200 MCG TABS: 200 | 2 days supply | Qty: 2 | Fill #0

## 2017-02-27 NOTE — Addendum Note (Signed)
Addended by: Gwendlyn Deutscher on: 02/27/2017 09:24 AM   Modules accepted: Orders

## 2017-02-27 NOTE — Progress Notes (Signed)
56 y.o. G3P2 MarriedCaucasianF here for annual exam.  Denies vaginal bleeding.  IUD due for removal today.  Has a small vulvar pimple that she would like me to check today.  No LMP recorded. Patient is not currently having periods (Reason: IUD).          Sexually active: Yes.    The current method of family planning is IUD.    Exercising: No.  The patient does not participate in regular exercise at present. Smoker:  no  Health Maintenance: Pap:  10/14/14 pap showed ASCUS but neg HR HPV History of abnormal Pap:  yes MMG:  05/16/16 BIRADS 1 negative/density b Colonoscopy:  10/30/12, small polyp (but final path was not a polyp just mucosal tissue). Follow up 10 years.  BMD:   none TDaP:  05/2007 Pneumonia vaccine(s):  Pneumovax 08/03/15 Zostavax:   none Hep C testing: 2018 negative per patient Screening Labs: PCP   reports that  has never smoked. she has never used smokeless tobacco. She reports that she does not drink alcohol or use drugs.  Past Medical History:  Diagnosis Date  . Abnormal Pap smear    h/o LEEP 12/11  . Fibroids   . Hypertension     Past Surgical History:  Procedure Laterality Date  . [place]   MAB  . LEEP  12/11   negative margins  . TONSILLECTOMY      Current Outpatient Medications  Medication Sig Dispense Refill  . levonorgestrel (MIRENA) 20 MCG/24HR IUD 1 each by Intrauterine route once.    Marland Kitchen losartan (COZAAR) 100 MG tablet Take 100 mg by mouth daily.    . rosuvastatin (CRESTOR) 5 MG tablet Take 5 mg by mouth daily.     No current facility-administered medications for this visit.     Family History  Problem Relation Age of Onset  . Hypertension Mother   . Hypertension Father   . Hypertension Sister   . Diabetes Maternal Grandfather   . Heart attack Paternal Grandmother        and strokes  . Heart disease Paternal Grandfather   . Colon cancer Neg Hx     ROS:  Pertinent items are noted in HPI.  Otherwise, a  comprehensive ROS was negative.  Exam:   BP 116/70 (BP Location: Right Arm, Patient Position: Sitting, Cuff Size: Normal)   Pulse 80   Resp 16   Ht 5' 5.25" (1.657 m)   Wt 187 lb (84.8 kg)   BMI 30.88 kg/m   Height: 5' 5.25" (165.7 cm)  Ht Readings from Last 3 Encounters:  02/27/17 5' 5.25" (1.657 m)  01/05/16 5\' 5"  (1.651 m)  10/14/14 5' 4.75" (1.645 m)    General appearance: alert, cooperative and appears stated age Head: Normocephalic, without obvious abnormality, atraumatic Neck: no adenopathy, supple, symmetrical, trachea midline and thyroid normal to inspection and palpation Lungs: clear to auscultation bilaterally Breasts: normal appearance, no masses or tenderness Heart: regular rate and rhythm Abdomen: soft, non-tender; bowel sounds normal; no masses,  no organomegaly Extremities: extremities normal, atraumatic, no cyanosis or edema Skin: Skin color, texture, turgor normal. No rashes or lesions Lymph nodes: Cervical, supraclavicular, and axillary nodes normal. No abnormal inguinal nodes palpated Neurologic: Grossly normal   Pelvic: External genitalia:  Small furuncle on left inferior labia, opened with small amount of purulent drainage.              Urethra:  normal appearing urethra with no masses, tenderness  or lesions              Bartholins and Skenes: normal                 Vagina: normal appearing vagina with normal color and discharge, no lesions              Cervix: no lesions              Pap taken: Yes.   Bimanual Exam:  Uterus:  normal size, contour, position, consistency, mobility, non-tender              Adnexa: normal adnexa and no mass, fullness, tenderness               Rectovaginal: Confirms               Anus:  normal sphincter tone, no lesions  Procedure:  Speculum placed.  Cervix cleansed with Betadine x 3.  Tenaculum applied to anterior lip of cervix.  Cervix dilated with milex dilator.  Curette passed to try and catch string.  With three passes  this was unsuccessful and pt was uncomfortable so procedure ended.  Chaperone was present for exam.  A:  Well Woman with normal exam H/O LEEP with CIN 1/2 H/O fibroids and menorrhagia S/sp mirena placement 2/13.  [place] [address].  Attempted removed unsuccessful today. Aware tdap due March, 2019.  Will do with Dr. Ernie Hew. Hypertension Elevated lipids Small vulvar furuncle  P:   Mammogram guidelines reviewed. Doing yearly. pap smear and HR HPV obtained today Lab work and vaccine done with Dr. Ernie Hew Pt will return for ultrasound guidance of IUD removal Return annually or prn

## 2017-02-28 LAB — CYTOLOGY - PAP
Diagnosis: NEGATIVE
HPV: NOT DETECTED

## 2017-03-05 MED FILL — LOSARTAN-HCTZ 100-12.5 MG T: 100-12.5 | 90 days supply | Qty: 90 | Fill #1

## 2017-03-05 MED FILL — ROSUVASTATIN CALCIUM 5 MG T: 5 | 90 days supply | Qty: 90 | Fill #2

## 2017-03-18 ENCOUNTER — Other Ambulatory Visit: Payer: Self-pay

## 2017-03-18 DIAGNOSIS — Z30432 Encounter for removal of intrauterine contraceptive device: Secondary | ICD-10-CM

## 2017-03-20 ENCOUNTER — Other Ambulatory Visit: Payer: Self-pay | Admitting: Obstetrics & Gynecology

## 2017-03-20 ENCOUNTER — Ambulatory Visit: Payer: 59

## 2017-03-20 ENCOUNTER — Ambulatory Visit (INDEPENDENT_AMBULATORY_CARE_PROVIDER_SITE_OTHER): Payer: 59 | Admitting: Obstetrics & Gynecology

## 2017-03-20 ENCOUNTER — Encounter: Payer: Self-pay | Admitting: Obstetrics & Gynecology

## 2017-03-20 VITALS — BP 106/66 | HR 68 | Resp 16 | Ht 65.25 in | Wt 181.0 lb

## 2017-03-20 DIAGNOSIS — D251 Intramural leiomyoma of uterus: Secondary | ICD-10-CM

## 2017-03-20 DIAGNOSIS — E785 Hyperlipidemia, unspecified: Secondary | ICD-10-CM | POA: Diagnosis not present

## 2017-03-20 DIAGNOSIS — Z30432 Encounter for removal of intrauterine contraceptive device: Secondary | ICD-10-CM

## 2017-03-20 DIAGNOSIS — N882 Stricture and stenosis of cervix uteri: Secondary | ICD-10-CM

## 2017-03-20 NOTE — Progress Notes (Signed)
GYNECOLOGY  VISIT  CC:   Ultrasound guided IUD removal  HPI: 57 y.o. G3P2 Married Caucasian female here for ultrasound guidance of IUD removal due to inability to see IUD string and due to stenotic cervical os.  She did use cytotec last night and this morning.  Did have a little cramping this morning.  Denies vaginal bleeding.  GYNECOLOGIC HISTORY: No LMP recorded. Patient is not currently having periods (Reason: IUD). Contraception: PMP Menopausal hormone therapy: none  Patient Active Problem List   Diagnosis Date Noted  . Elevated lipids   . Essential hypertension 10/14/2014  . Elevated cholesterol 10/14/2014  . IUD (intrauterine device) in place 10/14/2014    Past Medical History:  Diagnosis Date  . Abnormal Pap smear    h/o LEEP 12/11  . Elevated lipids   . Fibroids   . Hypertension     Past Surgical History:  Procedure Laterality Date  . DILATION AND CURETTAGE OF UTERUS  1992  . LEEP  12/11   negative margins  . TONSILLECTOMY      MEDS:   Current Outpatient Medications on File Prior to Visit  Medication Sig Dispense Refill  . levonorgestrel (MIRENA) 20 MCG/24HR IUD 1 each by Intrauterine route once.    Marland Kitchen losartan (COZAAR) 100 MG tablet Take 100 mg by mouth daily.    . misoprostol (CYTOTEC) 200 MCG tablet Place 1 tablet PV night before the procedure. Place 1 tablet PV the morning of the procedure. 2 tablet 0  . rosuvastatin (CRESTOR) 5 MG tablet Take 5 mg by mouth daily.     No current facility-administered medications on file prior to visit.     ALLERGIES: Patient has no known allergies.  Family History  Problem Relation Age of Onset  . Hypertension Mother   . Hypertension Father   . Hypertension Sister   . Diabetes Maternal Grandfather   . Heart attack Paternal Grandmother        and strokes  . Heart disease Paternal Grandfather   . Colon cancer Neg Hx     SH:  Married, non smoker  Review of Systems  All other systems reviewed and are  negative.   PHYSICAL EXAMINATION:    BP 106/66 (BP Location: Right Arm, Patient Position: Sitting, Cuff Size: Normal)   Pulse 68   Resp 16   Ht 5' 5.25" (1.657 m)   Wt 181 lb (82.1 kg)   BMI 29.89 kg/m     General appearance: alert, cooperative and appears stated age  Pelvic: External genitalia:  no lesions              Urethra:  normal appearing urethra with no masses, tenderness or lesions              Bartholins and Skenes: normal                 Vagina: normal appearing vagina with normal color and discharge, no lesions              Cervix: no lesions and no IUD string noted   Procedure:  With speculum in place and using sterile technique, anterior lip of cervix grasped with single toothed tenaculum.  Cervix dilated with milex dilator.  Curette was passed through os and into endometrial cavity.  Device rotated and then removed with IUD string then noted.  IUD string grasped with single ringed forceps and removed with one pull.  This was discarded.  Tenaculum removed.  Minimal bleeding noted.  Pt  tolerated procedure well.               Bimanual Exam:  Uterus:  normal size, contour, position, consistency, mobility, non-tender              Adnexa: no mass, fullness, tenderness  Chaperone was present for exam.  Assessment: IUD removal under ultrasound guidance Cervical stenosis Uterine fibroids  Plan: Pt will return for AEX as scheduled.  Precautions given.  Advised pelvic rest for 48 hours.     IUD removed under ultrasound guidance.   [place]

## 2017-03-22 DIAGNOSIS — D251 Intramural leiomyoma of uterus: Secondary | ICD-10-CM | POA: Insufficient documentation

## 2017-07-18 MED FILL — LOSARTAN-HCTZ 100-12.5 MG T: 100-12.5 | 90 days supply | Qty: 90 | Fill #2

## 2017-07-18 MED FILL — ROSUVASTATIN CALCIUM 5 MG T: 5 | 30 days supply | Qty: 30 | Fill #0

## 2017-08-22 DIAGNOSIS — I10 Essential (primary) hypertension: Secondary | ICD-10-CM | POA: Diagnosis not present

## 2017-08-22 DIAGNOSIS — W57XXXA Bitten or stung by nonvenomous insect and other nonvenomous arthropods, initial encounter: Secondary | ICD-10-CM | POA: Diagnosis not present

## 2017-08-22 DIAGNOSIS — Z6831 Body mass index (BMI) 31.0-31.9, adult: Secondary | ICD-10-CM | POA: Diagnosis not present

## 2017-08-22 DIAGNOSIS — Z13220 Encounter for screening for lipoid disorders: Secondary | ICD-10-CM | POA: Diagnosis not present

## 2017-08-22 DIAGNOSIS — E782 Mixed hyperlipidemia: Secondary | ICD-10-CM | POA: Diagnosis not present

## 2017-08-22 MED FILL — ROSUVASTATIN CALCIUM 5 MG T: 5 | 90 days supply | Qty: 90 | Fill #0

## 2017-12-25 DIAGNOSIS — I10 Essential (primary) hypertension: Secondary | ICD-10-CM | POA: Diagnosis not present

## 2017-12-25 DIAGNOSIS — F341 Dysthymic disorder: Secondary | ICD-10-CM | POA: Diagnosis not present

## 2017-12-25 DIAGNOSIS — F411 Generalized anxiety disorder: Secondary | ICD-10-CM | POA: Diagnosis not present

## 2017-12-25 DIAGNOSIS — Z6831 Body mass index (BMI) 31.0-31.9, adult: Secondary | ICD-10-CM | POA: Diagnosis not present

## 2017-12-25 MED FILL — ESCITALOPRAM 5 MG TABLET: 5 | 30 days supply | Qty: 60 | Fill #0

## 2017-12-25 MED FILL — VALSARTAN-HCTZ 320-25 MG TA: 320-25 | 30 days supply | Qty: 30 | Fill #0

## 2018-01-08 DIAGNOSIS — F411 Generalized anxiety disorder: Secondary | ICD-10-CM | POA: Diagnosis not present

## 2018-01-08 DIAGNOSIS — I10 Essential (primary) hypertension: Secondary | ICD-10-CM | POA: Diagnosis not present

## 2018-01-08 DIAGNOSIS — F341 Dysthymic disorder: Secondary | ICD-10-CM | POA: Diagnosis not present

## 2018-01-21 MED FILL — ESCITALOPRAM 5 MG TABLET: 5 | 30 days supply | Qty: 60 | Fill #1

## 2018-01-21 MED FILL — VALSARTAN-HCTZ 320-25 MG TA: 320-25 | 30 days supply | Qty: 30 | Fill #1

## 2018-02-19 DIAGNOSIS — I10 Essential (primary) hypertension: Secondary | ICD-10-CM | POA: Diagnosis not present

## 2018-02-19 DIAGNOSIS — F411 Generalized anxiety disorder: Secondary | ICD-10-CM | POA: Diagnosis not present

## 2018-02-19 DIAGNOSIS — Z6829 Body mass index (BMI) 29.0-29.9, adult: Secondary | ICD-10-CM | POA: Diagnosis not present

## 2018-02-19 DIAGNOSIS — R51 Headache: Secondary | ICD-10-CM | POA: Diagnosis not present

## 2018-02-19 MED FILL — ESCITALOPRAM 5 MG TABLET: 5 | 45 days supply | Qty: 90 | Fill #0

## 2018-02-19 MED FILL — VALSARTAN-HCTZ 320-25 MG TA: 320-25 | 90 days supply | Qty: 90 | Fill #0

## 2018-05-01 ENCOUNTER — Encounter: Payer: Self-pay | Admitting: Obstetrics & Gynecology

## 2018-05-01 ENCOUNTER — Other Ambulatory Visit (HOSPITAL_COMMUNITY)
Admission: RE | Admit: 2018-05-01 | Discharge: 2018-05-01 | Disposition: A | Discharge status: Home / Self Care | Payer: 59 | Source: Ambulatory Visit | Attending: Obstetrics & Gynecology | Admitting: Obstetrics & Gynecology

## 2018-05-01 ENCOUNTER — Ambulatory Visit (INDEPENDENT_AMBULATORY_CARE_PROVIDER_SITE_OTHER): Payer: 59 | Admitting: Obstetrics & Gynecology

## 2018-05-01 ENCOUNTER — Other Ambulatory Visit: Payer: Self-pay | Admitting: Obstetrics & Gynecology

## 2018-05-01 VITALS — BP 122/76 | HR 70 | Resp 16 | Ht 64.5 in | Wt 175.2 lb

## 2018-05-01 DIAGNOSIS — Z01419 Encounter for gynecological examination (general) (routine) without abnormal findings: Secondary | ICD-10-CM

## 2018-05-01 DIAGNOSIS — Z124 Encounter for screening for malignant neoplasm of cervix: Secondary | ICD-10-CM | POA: Diagnosis not present

## 2018-05-01 DIAGNOSIS — Z1231 Encounter for screening mammogram for malignant neoplasm of breast: Secondary | ICD-10-CM

## 2018-05-01 NOTE — Progress Notes (Signed)
58 y.o. G3P2 Married White or Caucasian female here for annual exam.  Doing well.  Denies vaginal bleeding.    Sexually active: Yes.    The current method of family planning is none.    Exercising: Yes.    Walking Smoker:  no  Health Maintenance: Pap:  02/27/17 Neg. HR HPV:neg   10/14/14 ASCUS. HR HPV:Neg  History of abnormal Pap:  Yes, LEEP CIN 2 MMG:  05/16/16 BIRADS1:Neg Colonoscopy:  10/30/12 polyps, Dr. Olevia Perches.  Biopsy showed lymphoid aggregate.   BMD:   NA TDaP:  Not sure will check with PCP  Pneumonia vaccine(s):  2017 Shingrix:   Not interested in this right now Hep C testing: done Screening Labs:scheduled for 05/05/2018   reports that she has never smoked. She has never used smokeless tobacco. She reports that she does not drink alcohol or use drugs.  Past Medical History:  Diagnosis Date  . Abnormal Pap smear    h/o LEEP 12/11  . Elevated lipids   . Fibroids   . Hypertension     Past Surgical History:  Procedure Laterality Date  . DILATION AND CURETTAGE OF UTERUS  1992  . LEEP  12/11   negative margins  . TONSILLECTOMY      Current Outpatient Medications  Medication Sig Dispense Refill  . escitalopram (LEXAPRO) 5 MG tablet Take 5 mg by mouth daily.    . rosuvastatin (CRESTOR) 5 MG tablet Take 5 mg by mouth daily.    . valsartan-hydrochlorothiazide (DIOVAN-HCT) 320-25 MG tablet Take 320 tablets by mouth daily.     No current facility-administered medications for this visit.     Family History  Problem Relation Age of Onset  . Hypertension Mother   . Hypertension Father   . Hypertension Sister   . Diabetes Maternal Grandfather   . Heart attack Paternal Grandmother        and strokes  . Heart disease Paternal Grandfather   . Colon cancer Neg Hx     Review of Systems  All other systems reviewed and are negative.   Exam:   BP 122/76   Pulse 70   Resp 16   Ht 5' 4.5" (1.638 m)   Wt 175 lb 3.2 oz (79.5 kg)   BMI 29.61 kg/m    Height: 5' 4.5" (163.8  cm)  Ht Readings from Last 3 Encounters:  05/01/18 5' 4.5" (1.638 m)  03/20/17 5' 5.25" (1.657 m)  02/27/17 5' 5.25" (1.657 m)    General appearance: alert, cooperative and appears stated age Head: Normocephalic, without obvious abnormality, atraumatic Neck: no adenopathy, supple, symmetrical, trachea midline and thyroid normal to inspection and palpation Lungs: clear to auscultation bilaterally Breasts: normal appearance, no masses or tenderness Heart: regular rate and rhythm Abdomen: soft, non-tender; bowel sounds normal; no masses,  no organomegaly Extremities: extremities normal, atraumatic, no cyanosis or edema Skin: Skin color, texture, turgor normal. No rashes or lesions Lymph nodes: Cervical, supraclavicular, and axillary nodes normal. No abnormal inguinal nodes palpated Neurologic: Grossly normal   Pelvic: External genitalia:  no lesions              Urethra:  normal appearing urethra with no masses, tenderness or lesions              Bartholins and Skenes: normal                 Vagina: normal appearing vagina with normal color and discharge, no lesions  Cervix: no lesions              Pap taken: Yes.   Bimanual Exam:  Uterus:  normal size, contour, position, consistency, mobility, non-tender              Adnexa: normal adnexa and no mass, fullness, tenderness               Rectovaginal: Confirms               Anus:  normal sphincter tone, no lesions  Chaperone was present for exam.  A:  Well Woman with normal exam PMP, no HRT H/o LEEP with CIN 1/2 Hypertension Elevated lipids  P:   Mammogram scheduled for pt 05/26/2018 at 7:10am.  This was scheduled for her today. pap smear with HR HPV obtained today Vaccine records from Dr. Ernie Hew will be signed for today Colonoscopy is UTD Plan BMD around age 19. Return annually or prn

## 2018-05-04 LAB — CYTOLOGY - PAP
Diagnosis: NEGATIVE
HPV: NOT DETECTED

## 2018-05-07 ENCOUNTER — Telehealth: Payer: Self-pay | Admitting: *Deleted

## 2018-05-07 NOTE — Telephone Encounter (Signed)
Called patient. Left message about immunization record received from Dr. Ernie Hew. Her Td was done 12/10/11. Due 12/09/2021.  Encounter closed.

## 2018-05-15 MED FILL — ESCITALOPRAM 5 MG TABLET: 5 | 45 days supply | Qty: 90 | Fill #1

## 2018-05-15 MED FILL — ROSUVASTATIN CALCIUM 5 MG T: 5 | 90 days supply | Qty: 90 | Fill #1

## 2018-05-15 MED FILL — VALSARTAN-HCTZ 320-25 MG TA: 320-25 | 90 days supply | Qty: 90 | Fill #1

## 2018-05-26 ENCOUNTER — Ambulatory Visit: Payer: 59

## 2018-06-30 ENCOUNTER — Ambulatory Visit: Payer: 59

## 2018-08-20 MED FILL — ESCITALOPRAM 5 MG TABLET: 5 | 45 days supply | Qty: 90 | Fill #2

## 2018-08-20 MED FILL — VALSARTAN-HCTZ 320-25 MG TA: 320-25 | 90 days supply | Qty: 90 | Fill #2

## 2018-08-24 ENCOUNTER — Other Ambulatory Visit: Payer: Self-pay

## 2018-08-24 ENCOUNTER — Ambulatory Visit
Admission: RE | Admit: 2018-08-24 | Discharge: 2018-08-24 | Disposition: A | Discharge status: Home / Self Care | Payer: 59 | Source: Ambulatory Visit | Attending: Obstetrics & Gynecology | Admitting: Obstetrics & Gynecology

## 2018-08-24 DIAGNOSIS — Z1231 Encounter for screening mammogram for malignant neoplasm of breast: Secondary | ICD-10-CM

## 2018-11-12 DIAGNOSIS — Z0000 Encounter for general adult medical examination without abnormal findings: Secondary | ICD-10-CM | POA: Diagnosis not present

## 2019-01-19 MED FILL — VALSARTAN-HCTZ 320-25 MG TA: 320-25 | 30 days supply | Qty: 30 | Fill #3

## 2019-01-19 MED FILL — ESCITALOPRAM 5 MG TABLET: 5 | 45 days supply | Qty: 90 | Fill #3

## 2019-02-18 MED FILL — VALSARTAN-HCTZ 320-25 MG TA: 320-25 | 30 days supply | Qty: 30 | Fill #4

## 2019-04-20 ENCOUNTER — Other Ambulatory Visit (HOSPITAL_COMMUNITY): Payer: Self-pay | Admitting: Family Medicine

## 2019-04-20 DIAGNOSIS — F341 Dysthymic disorder: Secondary | ICD-10-CM | POA: Diagnosis not present

## 2019-04-20 DIAGNOSIS — F411 Generalized anxiety disorder: Secondary | ICD-10-CM | POA: Diagnosis not present

## 2019-04-20 DIAGNOSIS — I10 Essential (primary) hypertension: Secondary | ICD-10-CM | POA: Diagnosis not present

## 2019-04-20 DIAGNOSIS — E782 Mixed hyperlipidemia: Secondary | ICD-10-CM | POA: Diagnosis not present

## 2019-04-20 MED FILL — VALSARTAN-HCTZ 320-25 MG TA: 320-25 | 90 days supply | Qty: 90 | Fill #0

## 2019-04-20 MED FILL — ESCITALOPRAM 5 MG TABLET: 5 | 45 days supply | Qty: 90 | Fill #0

## 2019-04-20 MED FILL — ROSUVASTATIN CALCIUM 5 MG T: 5 | 90 days supply | Qty: 90 | Fill #0

## 2019-05-04 ENCOUNTER — Other Ambulatory Visit: Payer: Self-pay | Admitting: Obstetrics & Gynecology

## 2019-05-04 NOTE — Telephone Encounter (Signed)
Returned call to patient. Patient states she is no longer taking this medication, but states Dr. Ernie Hew fills her blood pressure medications for her. RN advised would update pharmacy. Patient agreeable.

## 2019-05-04 NOTE — Telephone Encounter (Signed)
Message left to return call to Any Mcneice at [phone].    

## 2019-05-04 NOTE — Telephone Encounter (Signed)
Patient returned call

## 2019-08-31 ENCOUNTER — Telehealth: Payer: Self-pay

## 2019-08-31 NOTE — Telephone Encounter (Signed)
AEX 04/2018 Last physical with PCP 04/2019 H/o anal fissure repair at [facility] H/o Hemorrhoids  Colonoscopy:  10/30/12 polyps, Dr. Olevia Perches.  Biopsy showed lymphoid aggregate  Spoke with pt. Pt states having rectal bleeding x 1 day after having BM. Pt states had hemorrhoids in the past with a anal fissure repair as well.  Pt describes the bleeding as bright red and feels like it's increasing and hasn't stopped. Pt states having 2 solid/normal BMs in the last 24 hrs.  Pt states having chills last night, but did not take temperature. Pt also states having BM with blood last weekend x 1. States does not have a GI doctor.  Denies fever, abd pain, N/V/D, feeling weak, dizzy or lightheaded.   Pt made aware that Dr Sabra Heck not in office this week. Pt advised to be seen by either PCP or Urgent Care or ER for rectal bleeding. Pt agreeable and verbalized understanding stating will see [facility] urgent [facility] today. Pt advised will review with provider in office for any additional recommendations. Pt agreeable.   Routing to Dr Talbert Nan for review-covering provider for Dr Sabra Heck.  Encounter closed.

## 2019-08-31 NOTE — Telephone Encounter (Signed)
Patient is calling in regards to having rectal bleeding.

## 2019-09-21 NOTE — Progress Notes (Signed)
59 y.o. G3P2 Married White or Caucasian female here for annual exam.  Doing well.  Denies vaginal bleeding.    Had really bad hemorrhoid this past year.  She did see provider about this.  Cautery suggested.  Pt declined having this done.  Used some topical lidocaine.    Has new dogs.  Considering breeding them.  Was washing them in the yard and fell over.  Got some bites and they are now getting worse.  This was two days ago.  PMP.  No recent VB.    Sexually active: Yes.    The current method of family planning is post menopausal status.    Exercising: No.  exercise Smoker:  no  Health Maintenance: Pap:  02-27-17 neg HPV HR neg, 05-01-2018 neg HPV HR neg History of abnormal Pap:  Yes LEEP MMG:  08-24-2018 category b density birads 1:neg Colonoscopy:  10-30-12 polyps, Dr Olevia Perches, bx showed lymphoid aggregate BMD:   none TDaP:  2013 Pneumonia vaccine(s):  2017 Shingrix:   Discussed today Hep C testing: neg per patient Screening Labs: Dr. Ernie Hew.  Had virtual visit with her.    reports that she has never smoked. She has never used smokeless tobacco. She reports that she does not drink alcohol and does not use drugs.  Past Medical History:  Diagnosis Date  . Abnormal Pap smear    h/o LEEP 12/11  . Elevated lipids   . Fibroids   . Hypertension     Past Surgical History:  Procedure Laterality Date  . DILATION AND CURETTAGE OF UTERUS  1992  . LEEP  12/11   negative margins  . TONSILLECTOMY      Current Outpatient Medications  Medication Sig Dispense Refill  . escitalopram (LEXAPRO) 5 MG tablet Take 5 mg by mouth daily.    . rosuvastatin (CRESTOR) 5 MG tablet Take 5 mg by mouth daily.    . valsartan-hydrochlorothiazide (DIOVAN-HCT) 320-25 MG tablet Take 320 tablets by mouth daily.    . hydrocortisone (ANUSOL-HC) 2.5 % rectal cream Place rectally 2 (two) times daily. 30 g 1  . hydrocortisone (ANUSOL-HC) 25 MG suppository Place 1 suppository (25 mg total) rectally 2 (two) times  daily. 12 suppository 1  . predniSONE (DELTASONE) 5 MG tablet Day 1: 10mg  in AM, 5mg  at lunch and dinner, 10mg  at night.  Day 2: 5mg  in AM, lunch, dinner and 10mg  at night.  Day 3:  5mg  in AM, lunch, dinner and at night  Day 4:  5mg  in AM, lunch and bedtime  Day 5:  5mg  in AM and at night.  Day 6:  5mg  in AM. 21 tablet 0   No current facility-administered medications for this visit.    Family History  Problem Relation Age of Onset  . Hypertension Mother   . Hypertension Father   . Hypertension Sister   . Diabetes Maternal Grandfather   . Heart attack Paternal Grandmother        and strokes  . Heart disease Paternal Grandfather   . Colon cancer Neg Hx     Review of Systems  Constitutional: Negative.   HENT: Negative.   Eyes: Negative.   Respiratory: Negative.   Cardiovascular: Negative.   Gastrointestinal: Negative.   Endocrine: Negative.   Genitourinary: Negative.   Musculoskeletal: Negative.   Skin: Negative.   Allergic/Immunologic: Negative.   Neurological: Negative.   Hematological: Negative.   Psychiatric/Behavioral: Negative.     Exam:   BP 122/76   Pulse 70  Resp 16   Ht 5' 5.25" (1.657 m)   Wt 181 lb (82.1 kg)   LMP 04/02/2012   BMI 29.89 kg/m   Height: 5' 5.25" (165.7 cm)  General appearance: alert, cooperative and appears stated age Head: Normocephalic, without obvious abnormality, atraumatic Neck: no adenopathy, supple, symmetrical, trachea midline and thyroid normal to inspection and palpation Lungs: clear to auscultation bilaterally Breasts: normal appearance, no masses or tenderness Heart: regular rate and rhythm Abdomen: soft, non-tender; bowel sounds normal; no masses,  no organomegaly Extremities: extremities normal, atraumatic, no cyanosis or edema Skin: Skin color, texture, turgor normal. No rashes or lesions Lymph nodes: Cervical, supraclavicular, and axillary nodes normal. No abnormal inguinal nodes palpated Neurologic: Grossly  normal   Pelvic: External genitalia:  no lesions              Urethra:  normal appearing urethra with no masses, tenderness or lesions              Bartholins and Skenes: normal                 Vagina: normal appearing vagina with normal color and discharge, no lesions              Cervix: no lesions              Pap taken: No. Bimanual Exam:  Uterus:  normal size, contour, position, consistency, mobility, non-tender              Adnexa: normal adnexa and no mass, fullness, tenderness               Rectovaginal: Confirms               Anus:  normal sphincter tone, no lesions  Chaperone, Terence Lux, CMA, was present for exam.  A:  Well Woman with normal exam PMP, no HRT H/o LEEP, with CIN 1/2 2011 Hypertension H/o hyperlipidemia  Urticarial reaction to bug bites Hemorrhoids  P:   Mammogram guidelines reviewed. pap smear with neg HR HPV 2020.  Not indicated today. Colonoscopy due 2024 Lab work done with Dr. Ernie Hew Prednisone taper sent to pharmacy OTC cetrizine 10mg  bid recommended Hydrocortisone cream 2.5% bid and hydrocortisone suppositories to pharmacy on file return annually or prn

## 2019-09-23 ENCOUNTER — Other Ambulatory Visit: Payer: Self-pay

## 2019-09-23 ENCOUNTER — Encounter: Payer: Self-pay | Admitting: Obstetrics & Gynecology

## 2019-09-23 ENCOUNTER — Ambulatory Visit: Payer: 59 | Admitting: Obstetrics & Gynecology

## 2019-09-23 VITALS — BP 122/76 | HR 70 | Resp 16 | Ht 65.25 in | Wt 181.0 lb

## 2019-09-23 DIAGNOSIS — Z01419 Encounter for gynecological examination (general) (routine) without abnormal findings: Secondary | ICD-10-CM | POA: Diagnosis not present

## 2019-09-23 DIAGNOSIS — Z8719 Personal history of other diseases of the digestive system: Secondary | ICD-10-CM

## 2019-09-23 MED ORDER — PREDNISONE 5 MG PO TABS: ORAL_TABLET | ORAL | 0 refills | Status: DC

## 2019-09-23 MED ORDER — HYDROCORTISONE ACETATE 25 MG RE SUPP: 25.0000 mg | Freq: Two times a day (BID) | RECTAL | 1 refills | Status: DC

## 2019-09-23 MED ORDER — HYDROCORTISONE (PERIANAL) 2.5 % EX CREA: TOPICAL_CREAM | Freq: Two times a day (BID) | CUTANEOUS | 1 refills | Status: DC

## 2019-09-23 MED FILL — predniSONE 5 MG TABS: 5 | 6 days supply | Qty: 21 | Fill #0

## 2019-09-23 MED FILL — PROCTOZONE-HC 2.5 % CREA: 2.5 | 15 days supply | Qty: 30 | Fill #0

## 2019-09-23 NOTE — Patient Instructions (Addendum)
Certrizine 10mg  twice daily.  This is the same as zyrtec.    Grier Mitts and Betty [place].  Bone density next year. Get your mammogram this year.

## 2019-10-18 DIAGNOSIS — Z0000 Encounter for general adult medical examination without abnormal findings: Secondary | ICD-10-CM | POA: Diagnosis not present

## 2019-10-18 DIAGNOSIS — Z1159 Encounter for screening for other viral diseases: Secondary | ICD-10-CM | POA: Diagnosis not present

## 2019-12-02 MED FILL — ROSUVASTATIN CALCIUM 5 MG T: 5 | 90 days supply | Qty: 90 | Fill #2

## 2019-12-02 MED FILL — ESCITALOPRAM 5 MG TABLET: 5 | 45 days supply | Qty: 90 | Fill #2

## 2019-12-02 MED FILL — VALSARTAN-HCTZ 320-25 MG TA: 320-25 | 90 days supply | Qty: 90 | Fill #2

## 2020-01-11 ENCOUNTER — Encounter: Payer: Self-pay | Admitting: Obstetrics & Gynecology

## 2020-03-17 MED FILL — ROSUVASTATIN CALCIUM 5 MG T: 5 | 90 days supply | Qty: 90 | Fill #3

## 2020-03-17 MED FILL — VALSARTAN-HCTZ 320-25 MG TA: 320-25 | 90 days supply | Qty: 90 | Fill #3

## 2020-12-05 ENCOUNTER — Other Ambulatory Visit (HOSPITAL_COMMUNITY): Payer: Self-pay

## 2020-12-05 ENCOUNTER — Ambulatory Visit (INDEPENDENT_AMBULATORY_CARE_PROVIDER_SITE_OTHER): Payer: No Typology Code available for payment source | Admitting: Nurse Practitioner

## 2020-12-05 ENCOUNTER — Encounter (HOSPITAL_BASED_OUTPATIENT_CLINIC_OR_DEPARTMENT_OTHER): Payer: Self-pay | Admitting: Nurse Practitioner

## 2020-12-05 ENCOUNTER — Other Ambulatory Visit: Payer: Self-pay

## 2020-12-05 VITALS — BP 162/87 | HR 75 | Ht 65.0 in | Wt 175.2 lb

## 2020-12-05 DIAGNOSIS — Z13228 Encounter for screening for other metabolic disorders: Secondary | ICD-10-CM

## 2020-12-05 DIAGNOSIS — Z1231 Encounter for screening mammogram for malignant neoplasm of breast: Secondary | ICD-10-CM | POA: Diagnosis not present

## 2020-12-05 DIAGNOSIS — Z0000 Encounter for general adult medical examination without abnormal findings: Secondary | ICD-10-CM

## 2020-12-05 DIAGNOSIS — F419 Anxiety disorder, unspecified: Secondary | ICD-10-CM | POA: Insufficient documentation

## 2020-12-05 DIAGNOSIS — I10 Essential (primary) hypertension: Secondary | ICD-10-CM

## 2020-12-05 DIAGNOSIS — Z1329 Encounter for screening for other suspected endocrine disorder: Secondary | ICD-10-CM | POA: Diagnosis not present

## 2020-12-05 DIAGNOSIS — E559 Vitamin D deficiency, unspecified: Secondary | ICD-10-CM

## 2020-12-05 DIAGNOSIS — E785 Hyperlipidemia, unspecified: Secondary | ICD-10-CM

## 2020-12-05 DIAGNOSIS — Z130 Encounter for screening for diseases of the blood and blood-forming organs and certain disorders involving the immune mechanism: Secondary | ICD-10-CM

## 2020-12-05 DIAGNOSIS — Z1321 Encounter for screening for nutritional disorder: Secondary | ICD-10-CM

## 2020-12-05 MED ORDER — VALSARTAN-HYDROCHLOROTHIAZIDE 320-25 MG PO TABS
1.0000 | ORAL_TABLET | Freq: Every day | ORAL | 3 refills | Status: DC
  Filled 2020-12-05 – 2020-12-13 (×2): qty 90, 90d supply, fill #0
  Filled 2021-06-14: qty 90, 90d supply, fill #1
  Filled 2021-09-11: qty 90, 90d supply, fill #2

## 2020-12-05 MED ORDER — ROSUVASTATIN CALCIUM 5 MG PO TABS
5.0000 mg | ORAL_TABLET | Freq: Every day | ORAL | 3 refills | Status: DC
  Filled 2020-12-05 – 2020-12-13 (×2): qty 90, 90d supply, fill #0
  Filled 2021-06-14: qty 90, 90d supply, fill #1
  Filled 2021-09-11: qty 90, 90d supply, fill #2

## 2020-12-05 NOTE — Assessment & Plan Note (Addendum)
BP is elevated today. Patient has been out of BP medication for 6 months. Will restart Valsartan-HCTZ 320-25 mg daily. Labs today No alarm symptoms present today Recommend at home monitoring of BP daily for 2 weeks and f/u with virtual visit for check.

## 2020-12-05 NOTE — Assessment & Plan Note (Signed)
History of HLD Previously on crestror 5mg  daily Out of medication for 6 months Labs today Refills provided Recommend diet low in saturated fat and cholesterol and increase daily fiber. F/U in 12 months or sooner if needed.

## 2020-12-05 NOTE — Assessment & Plan Note (Signed)
No medications at this time.  Patient feels it is well controlled.  No red flags today Patient will let me know if she has any new concerns or would like to restart medications.

## 2020-12-05 NOTE — Assessment & Plan Note (Signed)
Review of current and past medical history, social history, medication, and family history.  Review of care gaps and health maintenance recommendations.  Records from recent providers to be requested if not available in Chart Review or Care Everywhere.  Recommendations for health maintenance, diet, and exercise provided.  Labs today: CPE labs HM Recommendations: Mammogram, Flu, Shingles due.  Will get flu shot at work, thinking about shingles vaccine, mammogram ordered.  CPE due: Done today

## 2020-12-05 NOTE — Progress Notes (Signed)
Orma Render, DNP, AGNP-c Primary Care & Sports Medicine [address]  [place], [place] [address] [phone]  New patient visit   Patient: Darlene Shelton   DOB: 1960/05/08   60 y.o. Female  MRN: [id number] Visit Date: 12/05/2020  Patient Care Team: Mcclellan Demarais, Coralee Pesa, NP as PCP - General (Nurse Practitioner) Megan Salon, MD as Consulting Physician (Gynecology)  Today's healthcare provider: Orma Render, NP   Chief Complaint  Patient presents with   Establish Care    Patient states she wants to establish care so that she can get her medications refilled.   Subjective    Darlene Shelton is a 60 y.o. female who presents today as a new patient to establish care.  HPI  HTN Diagnosed about 12 years ago Historically treated with valsartan-HCTZ combo- reports well controlled whole on medications Out of medication for the last 6 months or so BP has been elevated since she has stopped taking meds "One or two" episodes of headache and seeing "spots", but this resolved quickly. Feels this was related to elevated BP. No CP, ShOB, LE edema, palpitations Has been checking at home and it has been high.  No special diet, but does avoid excess sodium intake.   Weight loss 10-15 lbs in the last 6 months due to increased activity Diet is a mixture of healthy and unhealthy.   Anxiety Previously on lexapro- stopped about 6 months ago Feels her symptoms are well controlled at this time.   She works as a Marketing executive at [employer].   Past Medical History:  Diagnosis Date   Abnormal Pap smear    h/o LEEP 12/11   Anxiety    Elevated lipids    Fibroids    Hypertension    Past Surgical History:  Procedure Laterality Date   DILATION AND CURETTAGE OF UTERUS  1992   LEEP  12/11   negative margins   TONSILLECTOMY     Family Status  Relation Name Status   Mother  Alive   Father  Alive   Sister  (Not Specified)   MGF  (Not Specified)    PGM  (Not Specified)   PGF  (Not Specified)   Neg Hx  (Not Specified)   Family History  Problem Relation Age of Onset   Hypertension Mother    Hypertension Father    Hypertension Sister    Diabetes Maternal Grandfather    Stroke Paternal Grandmother    Heart attack Paternal Grandmother        and strokes   Heart disease Paternal Grandfather    Colon cancer Neg Hx    Social History   Socioeconomic History   Marital status: Married    Spouse name: Not on file   Number of children: Not on file   Years of education: Not on file   Highest education level: Not on file  Occupational History   Not on file  Tobacco Use   Smoking status: Never   Smokeless tobacco: Never  Vaping Use   Vaping Use: Never used  Substance and Sexual Activity   Alcohol use: No   Drug use: No   Sexual activity: Yes    Partners: Male    Birth control/protection: Post-menopausal  Other Topics Concern   Not on file  Social History Narrative   Not on file   Social Determinants of Health   Financial Resource Strain: Not on file  Food Insecurity: Not on  file  Transportation Needs: Not on file  Physical Activity: Not on file  Stress: Not on file  Social Connections: Not on file   Outpatient Medications Prior to Visit  Medication Sig   hydrocortisone (ANUSOL-HC) 2.5 % rectal cream Place rectally 2 (two) times daily.   hydrocortisone (ANUSOL-HC) 25 MG suppository Place 1 suppository (25 mg total) rectally 2 (two) times daily.   [DISCONTINUED] escitalopram (LEXAPRO) 5 MG tablet Take 5 mg by mouth daily.   [DISCONTINUED] rosuvastatin (CRESTOR) 5 MG tablet Take 5 mg by mouth daily.   [DISCONTINUED] valsartan-hydrochlorothiazide (DIOVAN-HCT) 320-25 MG tablet Take 320 tablets by mouth daily.   [DISCONTINUED] predniSONE (DELTASONE) 5 MG tablet Day 1: 10mg  in AM, 5mg  at lunch and dinner, 10mg  at night.  Day 2: 5mg  in AM, lunch, dinner and 10mg  at night.  Day 3:  5mg  in AM, lunch, dinner and at night  Day  4:  5mg  in AM, lunch and bedtime  Day 5:  5mg  in AM and at night.  Day 6:  5mg  in AM. (Patient not taking: Reported on 12/05/2020)   [DISCONTINUED] rosuvastatin (CRESTOR) 5 MG tablet TAKE 1 TABLET BY MOUTH EVERY NIGHT AT BEDTIME   [DISCONTINUED] valsartan-hydrochlorothiazide (DIOVAN-HCT) 320-25 MG tablet TAKE 1 TABLET BY MOUTH ONCE DAILY   No facility-administered medications prior to visit.   No Known Allergies  Immunization History  Administered Date(s) Administered   DTaP 12/10/2011   Influenza-Unspecified 12/02/2017, 12/01/2018   Pneumococcal Conjugate-13 10/30/2015   Pneumococcal Polysaccharide-23 10/30/2015    Health Maintenance  Topic Date Due   HIV Screening  Never done   MAMMOGRAM  08/23/2020   Zoster Vaccines- Shingrix (1 of 2) 03/07/2021 (Originally 12/11/2009)   INFLUENZA VACCINE  06/01/2021 (Originally 10/02/2020)   PAP SMEAR-Modifier  05/01/2021   TETANUS/TDAP  12/09/2021   COLONOSCOPY (Pts 45-12yrs Insurance coverage will need to be confirmed)  10/31/2022   Hepatitis C Screening  Completed   HPV VACCINES  Aged Out    Patient Care Team: Iyan Flett, Coralee Pesa, NP as PCP - General (Nurse Practitioner) Megan Salon, MD as Consulting Physician (Gynecology)  Review of Systems All review of systems negative except what is listed in the HPI    Objective    BP (!) 162/87   Pulse 75   Ht 5\' 5"  (1.651 m)   Wt 175 lb 3.2 oz (79.5 kg)   LMP 04/02/2012   SpO2 100%   BMI 29.15 kg/m  Physical Exam Vitals and nursing note reviewed.  Constitutional:      Appearance: Normal appearance.  HENT:     Head: Normocephalic.     Right Ear: Tympanic membrane, ear canal and external ear normal.     Left Ear: Tympanic membrane and external ear normal.     Nose: Nose normal.     Mouth/Throat:     Mouth: Mucous membranes are moist.     Pharynx: Oropharynx is clear.  Eyes:     Extraocular Movements: Extraocular movements intact.     Conjunctiva/sclera: Conjunctivae normal.      Pupils: Pupils are equal, round, and reactive to light.     Funduscopic exam:    Right eye: Red reflex present.        Left eye: Red reflex present. Neck:     Vascular: No carotid bruit.  Cardiovascular:     Rate and Rhythm: Normal rate and regular rhythm.     Pulses: Normal pulses.     Heart sounds: Normal heart sounds. No murmur  heard. Pulmonary:     Effort: Pulmonary effort is normal. No respiratory distress.     Breath sounds: Normal breath sounds.  Abdominal:     General: Abdomen is flat. Bowel sounds are normal. There is no distension.     Palpations: Abdomen is soft. There is no mass.     Tenderness: There is no abdominal tenderness. There is no right CVA tenderness, left CVA tenderness, guarding or rebound.     Hernia: No hernia is present.  Musculoskeletal:        General: Normal range of motion.     Cervical back: Normal range of motion.     Right lower leg: No edema.     Left lower leg: No edema.  Skin:    General: Skin is warm and dry.     Capillary Refill: Capillary refill takes less than 2 seconds.  Neurological:     General: No focal deficit present.     Mental Status: She is alert and oriented to person, place, and time.     Cranial Nerves: No cranial nerve deficit.     Sensory: No sensory deficit.     Motor: No weakness.     Gait: Gait normal.  Psychiatric:        Mood and Affect: Mood normal.        Behavior: Behavior normal.        Thought Content: Thought content normal.        Judgment: Judgment normal.     Depression Screen PHQ 2/9 Scores 12/05/2020  PHQ - 2 Score 2  PHQ- 9 Score 6   No results found for any visits on 12/05/20.  Assessment & Plan      Problem List Items Addressed This Visit     Hypertension    BP is elevated today. Patient has been out of BP medication for 6 months. Will restart Valsartan-HCTZ 320-25 mg daily. Labs today No alarm symptoms present today Recommend at home monitoring of BP daily for 2 weeks and f/u with  virtual visit for check.       Relevant Medications   rosuvastatin (CRESTOR) 5 MG tablet   valsartan-hydrochlorothiazide (DIOVAN-HCT) 320-25 MG tablet   Hyperlipidemia    History of HLD Previously on crestror 5mg  daily Out of medication for 6 months Labs today Refills provided Recommend diet low in saturated fat and cholesterol and increase daily fiber. F/U in 12 months or sooner if needed.       Relevant Medications   rosuvastatin (CRESTOR) 5 MG tablet   valsartan-hydrochlorothiazide (DIOVAN-HCT) 320-25 MG tablet   Other Relevant Orders   Lipid panel   Encounter for medical examination to establish care - Primary    Review of current and past medical history, social history, medication, and family history.  Review of care gaps and health maintenance recommendations.  Records from recent providers to be requested if not available in Chart Review or Care Everywhere.  Recommendations for health maintenance, diet, and exercise provided.  Labs today: CPE labs HM Recommendations: Mammogram, Flu, Shingles due.  Will get flu shot at work, thinking about shingles vaccine, mammogram ordered.  CPE due: Done today       Anxiety    No medications at this time.  Patient feels it is well controlled.  No red flags today Patient will let me know if she has any new concerns or would like to restart medications.       Other Visit Diagnoses     Screening  for endocrine, nutritional, metabolic and immunity disorder       Relevant Orders   Comprehensive metabolic panel   TSH   VITAMIN D 25 Hydroxy (Vit-D Deficiency, Fractures)   Screening for thyroid disorder       Relevant Orders   TSH   Screening mammogram for breast cancer       Relevant Orders   MM 3D SCREEN BREAST BILATERAL   Vitamin D deficiency       Relevant Orders   VITAMIN D 25 Hydroxy (Vit-D Deficiency, Fractures)        Return in about 2 weeks (around 12/19/2020) for Telephone Visit F/U BP.      Khristen Cheyney, Coralee Pesa,  NP, DNP, AGNP-C Primary Care & Sports Medicine at [place]

## 2020-12-05 NOTE — Patient Instructions (Addendum)
Recommendations from today's visit: Keep up the great work on exercise and weight loss! You are doing great! I would like you to monitor your blood pressure at home once a day for the next 2 weeks. I would like to touch base with you on a telephone visit in 2 weeks to make sure that you are where you should be.   Information on diet, exercise, and health maintenance recommendations are listed below. This is information to help you be sure you are on track for optimal health and monitoring.   Please look over this and let us know if you have any questions or if you have completed any of the health maintenance outside of [facility] so that we can be sure your records are up to date.  ___________________________________________________________  Thank you for choosing [facility] at [facility] for your Primary Care needs. I am excited for the opportunity to partner with you to meet your health care goals. It was a pleasure meeting you today!  I am an Adult-Geriatric Nurse Practitioner with a background in caring for patients for more than 20 years. I provide primary care and sports medicine services to patients age 38 and older within this office. I am also the director of the APP Fellowship with [facility].   I am passionate about providing the best service to you through preventive medicine and supportive care. I consider you a part of the medical team and value your input. I work diligently to ensure that you are heard and your needs are met in a safe and effective manner. I want you to feel comfortable with me as your provider and want you to know that your health concerns are important to me.  For your information, our office hours are Monday- Friday 8:00 AM - 5:00 PM At this time I am not in the office on Wednesdays.  If you have questions or concerns, please call our office at [phone] or send [place] a MyChart message and we will respond as quickly as possible.   For all  urgent or time sensitive needs we ask that you please call the office to avoid delays. MyChart is not constantly monitored and replies may take up to 72 business hours.  MyChart Policy: MyChart allows for you to see your visit notes, after visit summary, provider recommendations, lab and tests results, make an appointment, request refills, and contact your provider or the office for non-urgent questions or concerns. Providers are seeing patients during normal business hours and do not have built in time to review MyChart messages.  We ask that you allow a minimum of 4 business days for responses to [employer]. For this reason, please do not send urgent requests through [place]. Please call the office at [phone]. Complex MyChart concerns may require a visit. Your provider may request you schedule a virtual or in person visit to ensure we are providing the best care possible. MyChart messages sent after 4:00 PM on Friday will not be received by the provider until Monday morning.    Lab and Test Results: You will receive your lab and test results on MyChart as soon as they are completed and results have been sent by the lab or testing facility. Due to this service, you will receive your results BEFORE your provider.  I review lab and tests results each morning prior to seeing patients. Some results require collaboration with other providers to ensure you are receiving the most appropriate care. For this reason,  we ask that you please allow a minimum of 4 business days for your provider to receive and review lab and test results and contact you about these.  Most lab and test result comments from the provider will be sent through [place]. Your provider may recommend changes to the plan of care, follow-up visits, repeat testing, ask questions, or request an office visit to discuss these results. You may reply directly to this message or call the office at [phone] to provide information for the  provider or set up an appointment. In some instances, you will be called with test results and recommendations. Please let us know if this is preferred and we will make note of this in your chart to provide this for you.    If you have not heard a response to your lab or test results in 72 business hours, please call the office to let us know.   After Hours: For all non-emergency after hours needs, please call the office at [phone] and select the option to reach the on-call provider service. On-call services are shared between multiple [facility] offices and therefore it will not be possible to speak directly with your provider. On-call providers may provide medical advice and recommendations, but are unable to provide refills for maintenance medications.  For all emergency or urgent medical needs after normal business hours, we recommend that you seek care at the closest Urgent Care or Emergency Department to ensure appropriate treatment in a timely manner.  [facility] has a 24 hour emergency room located on the ground floor for your convenience.    Please do not hesitate to reach out to [place] with concerns.   Thank you, again, for choosing me as your health care partner. I appreciate your trust and look forward to learning more about you.   Worthy Keeler, DNP, AGNP-c ___________________________________________________________  Health Maintenance Recommendations Screening Testing Mammogram Every 1 -2 years based on history and risk factors Starting at age 39 Pap Smear Ages 21-39 every 3 years Ages 56-65 every 5 years with HPV testing More frequent testing may be required based on results and history Colon Cancer Screening Every 1-10 years based on test performed, risk factors, and history Starting at age 37 Bone Density Screening Every 2-10 years based on history Starting at age 29 for women Recommendations for men differ based on medication usage,  history, and risk factors AAA Screening One time ultrasound Men 41-64 years old who have every smoked Lung Cancer Screening Low Dose Lung CT every 12 months Age 33-80 years with a 30 pack-year smoking history who still smoke or who have quit within the last 15 years  Screening Labs Routine  Labs: Complete Blood Count (CBC), Complete Metabolic Panel (CMP), Cholesterol (Lipid Panel) Every 6-12 months based on history and medications May be recommended more frequently based on current conditions or previous results Hemoglobin A1c Lab Every 3-12 months based on history and previous results Starting at age 42 or earlier with diagnosis of diabetes, high cholesterol, BMI >26, and/or risk factors Frequent monitoring for patients with diabetes to ensure blood sugar control Thyroid Panel (TSH w/ T3 & T4) Every 6 months based on history, symptoms, and risk factors May be repeated more often if on medication HIV One time testing for all patients 4 and older May be repeated more frequently for patients with increased risk factors or exposure Hepatitis C One time testing for all patients 65 and older May be repeated more frequently for patients  with increased risk factors or exposure Gonorrhea, Chlamydia Every 12 months for all sexually active persons 13-24 years Additional monitoring may be recommended for those who are considered high risk or who have symptoms PSA Men 41-45 years old with risk factors Additional screening may be recommended from age 78-69 based on risk factors, symptoms, and history  Vaccine Recommendations Tetanus Booster All adults every 10 years Flu Vaccine All patients 6 months and older every year COVID Vaccine All patients 12 years and older Initial dosing with booster May recommend additional booster based on age and health history HPV Vaccine 2 doses all patients age 36-26 Dosing may be considered for patients over 26 Shingles Vaccine (Shingrix) 2 doses all  adults 63 years and older Pneumonia (Pneumovax 23) All adults 80 years and older May recommend earlier dosing based on health history Pneumonia (Prevnar 46) All adults 11 years and older Dosed 1 year after Pneumovax 23  Additional Screening, Testing, and Vaccinations may be recommended on an individualized basis based on family history, health history, risk factors, and/or exposure.  __________________________________________________________  Diet Recommendations for All Patients  I recommend that all patients maintain a diet low in saturated fats, carbohydrates, and cholesterol. While this can be challenging at first, it is not impossible and small changes can make big differences.  Things to try: Decreasing the amount of soda, sweet tea, and/or juice to one or less per day and replace with water While water is always the first choice, if you do not like water you may consider adding a water additive without sugar to improve the taste other sugar free drinks Replace potatoes with a brightly colored vegetable at dinner Use healthy oils, such as canola oil or olive oil, instead of butter or hard margarine Limit your bread intake to two pieces or less a day Replace regular pasta with low carb pasta options Bake, broil, or grill foods instead of frying Monitor portion sizes  Eat smaller, more frequent meals throughout the day instead of large meals  An important thing to remember is, if you love foods that are not great for your health, you don't have to give them up completely. Instead, allow these foods to be a reward when you have done well. Allowing yourself to still have special treats every once in a while is a nice way to tell yourself thank you for working hard to keep yourself healthy.   Also remember that every day is a new day. If you have a bad day and "fall off the wagon", you can still climb right back up and keep moving along on your journey!  We have resources available to  help you!  Some websites that may be helpful include: www.http://carter.biz/  Www.VeryWellFit.com _____________________________________________________________  Activity Recommendations for All Patients  I recommend that all adults get at least 20 minutes of moderate physical activity that elevates your heart rate at least 5 days out of the week.  Some examples include: Walking or jogging at a pace that allows you to carry on a conversation Cycling (stationary bike or outdoors) Water aerobics Yoga Weight lifting Dancing If physical limitations prevent you from putting stress on your joints, exercise in a pool or seated in a chair are excellent options.  Do determine your MAXIMUM heart rate for activity: YOUR AGE - 220 = MAX HeartRate   Remember! Do not push yourself too hard.  Start slowly and build up your pace, speed, weight, time in exercise, etc.  Allow your body to rest  between exercise and get good sleep. You will need more water than normal when you are exerting yourself. Do not wait until you are thirsty to drink. Drink with a purpose of getting in at least 8, 8 ounce glasses of water a day plus more depending on how much you exercise and sweat.    If you begin to develop dizziness, chest pain, abdominal pain, jaw pain, shortness of breath, headache, vision changes, lightheadedness, or other concerning symptoms, stop the activity and allow your body to rest. If your symptoms are severe, seek emergency evaluation immediately. If your symptoms are concerning, but not severe, please let us know so that we can recommend further evaluation.   ________________________________________________________________

## 2020-12-06 LAB — COMPREHENSIVE METABOLIC PANEL
ALT: 19 IU/L (ref 0–32)
AST: 28 IU/L (ref 0–40)
Albumin/Globulin Ratio: 2.4 — ABNORMAL HIGH (ref 1.2–2.2)
Albumin: 4.7 g/dL (ref 3.8–4.9)
Alkaline Phosphatase: 88 IU/L (ref 44–121)
BUN/Creatinine Ratio: 14 (ref 12–28)
BUN: 12 mg/dL (ref 8–27)
Bilirubin Total: 0.6 mg/dL (ref 0.0–1.2)
CO2: 22 mmol/L (ref 20–29)
Calcium: 9.9 mg/dL (ref 8.7–10.3)
Chloride: 104 mmol/L (ref 96–106)
Creatinine, Ser: 0.83 mg/dL (ref 0.57–1.00)
Globulin, Total: 2 g/dL (ref 1.5–4.5)
Glucose: 104 mg/dL — ABNORMAL HIGH (ref 70–99)
Potassium: 4.3 mmol/L (ref 3.5–5.2)
Sodium: 142 mmol/L (ref 134–144)
Total Protein: 6.7 g/dL (ref 6.0–8.5)
eGFR: 81 mL/min/{1.73_m2} (ref >59)

## 2020-12-06 LAB — CBC WITH DIFFERENTIAL
Basophils Absolute: 0 10*3/uL (ref 0.0–0.2)
Basos: 0 %
EOS (ABSOLUTE): 0.1 10*3/uL (ref 0.0–0.4)
Eos: 1 %
Hematocrit: 39.2 % (ref 34.0–46.6)
Hemoglobin: 13.4 g/dL (ref 11.1–15.9)
Immature Grans (Abs): 0 10*3/uL (ref 0.0–0.1)
Immature Granulocytes: 0 %
Lymphocytes Absolute: 1.8 10*3/uL (ref 0.7–3.1)
Lymphs: 27 %
MCH: 30.6 pg (ref 26.6–33.0)
MCHC: 34.2 g/dL (ref 31.5–35.7)
MCV: 90 fL (ref 79–97)
Monocytes Absolute: 0.3 10*3/uL (ref 0.1–0.9)
Monocytes: 5 %
Neutrophils Absolute: 4.3 10*3/uL (ref 1.4–7.0)
Neutrophils: 67 %
RBC: 4.38 x10E6/uL (ref 3.77–5.28)
RDW: 11.9 % (ref 11.7–15.4)
WBC: 6.4 10*3/uL (ref 3.4–10.8)

## 2020-12-06 LAB — LIPID PANEL
Chol/HDL Ratio: 3.8 ratio (ref 0.0–4.4)
Cholesterol, Total: 217 mg/dL — ABNORMAL HIGH (ref 100–199)
HDL: 57 mg/dL (ref >39)
LDL Chol Calc (NIH): 137 mg/dL — ABNORMAL HIGH (ref 0–99)
Triglycerides: 129 mg/dL (ref 0–149)
VLDL Cholesterol Cal: 23 mg/dL (ref 5–40)

## 2020-12-06 LAB — VITAMIN D 25 HYDROXY (VIT D DEFICIENCY, FRACTURES): Vit D, 25-Hydroxy: 25.5 ng/mL — ABNORMAL LOW (ref 30.0–100.0)

## 2020-12-06 LAB — TSH: TSH: 2.2 u[IU]/mL (ref 0.450–4.500)

## 2020-12-13 ENCOUNTER — Other Ambulatory Visit (HOSPITAL_COMMUNITY): Payer: Self-pay

## 2020-12-13 ENCOUNTER — Telehealth (HOSPITAL_BASED_OUTPATIENT_CLINIC_OR_DEPARTMENT_OTHER): Payer: Self-pay

## 2020-12-13 DIAGNOSIS — E559 Vitamin D deficiency, unspecified: Secondary | ICD-10-CM

## 2020-12-13 MED ORDER — VITAMIN D (ERGOCALCIFEROL) 1.25 MG (50000 UNIT) PO CAPS
50000.0000 [IU] | ORAL_CAPSULE | ORAL | 0 refills | Status: DC
  Filled 2020-12-13: qty 8, 56d supply, fill #0

## 2020-12-13 NOTE — Addendum Note (Signed)
Addended by: Virginia Crews D on: 12/13/2020 12:33 PM   Modules accepted: Orders

## 2020-12-13 NOTE — Telephone Encounter (Signed)
Called to discuss lab results with patient. Patient is aware and agreeable to lab results and recommendations. Patient is agreeable to starting Vitamin D3. Patient scheduled nurse visit for follow up labs. Prescription sent. Instructed patient to contact the office with questions and concerns.

## 2020-12-13 NOTE — Telephone Encounter (Signed)
-----   Message from Orma Render, NP sent at 12/06/2020  7:42 AM EDT ----- Please call patient:  Was she fasting during labs? If she was, please add A1c to labs for further evaluation of elevated blood glucose.   Overall cholesterol looks good. If fasting, I recommend monitoring saturated fat intake to help lower LDL.   Vitamin D is low. Recommend 50,000iU Vitamin D3 for 8 weeks then recheck levels.  OK to send script to pharmacy for low vitamin D and schedule f/u lab visit only in 8 weeks for Vitamin D

## 2020-12-19 ENCOUNTER — Encounter (HOSPITAL_BASED_OUTPATIENT_CLINIC_OR_DEPARTMENT_OTHER): Payer: Self-pay | Admitting: Nurse Practitioner

## 2020-12-19 ENCOUNTER — Other Ambulatory Visit: Payer: Self-pay

## 2020-12-19 ENCOUNTER — Ambulatory Visit (INDEPENDENT_AMBULATORY_CARE_PROVIDER_SITE_OTHER): Payer: No Typology Code available for payment source | Admitting: Nurse Practitioner

## 2020-12-19 VITALS — BP 141/92 | Ht 64.0 in | Wt 171.0 lb

## 2020-12-19 DIAGNOSIS — H5789 Other specified disorders of eye and adnexa: Secondary | ICD-10-CM | POA: Insufficient documentation

## 2020-12-19 DIAGNOSIS — I10 Essential (primary) hypertension: Secondary | ICD-10-CM | POA: Diagnosis not present

## 2020-12-19 NOTE — Patient Instructions (Signed)
Plan to follow-up in 3 months for labs.   Monitor BP at home/work and notify if readings are consistently higher than 130/85. We want to maintain numbers lower than this for best control and protection.   Let me know if the numbers are staying high over the next 2 weeks  If you eye begins to bother you more or doesn't get better, I recommend seeing an ophthalmologist for evaluation as they can best visualize the eye to ensure that there are not any scratches or remaining debris in the eye.

## 2020-12-19 NOTE — Assessment & Plan Note (Signed)
On new BP meds for less than one week. There is some improvement in BP, but not where we need it to be.  We will plan for her to monitor at home/work for the next two weeks and she will let me know if her numbers are >130/85. If numbers are below goal of 130/85 she will continue to monitor and we will follow-up in 3 months with labs. If her numbers remain elevated or she notices any changes or concerning findings, we will plan to increase the Valsartan component of the medication to achieve goal. F/U in 3 months for in person visit with labs for BP

## 2020-12-19 NOTE — Progress Notes (Signed)
Virtual Visit Encounter telephone visit.   I connected with  Darlene Shelton on 12/19/20 at 10:10 AM EDT by secure audio and/or video enabled telemedicine application. I verified that I am speaking with the correct person using two identifiers.   I introduced myself as a Designer, jewellery with the practice. The limitations of evaluation and management by telemedicine discussed with the patient and the availability of in person appointments. The patient expressed verbal understanding and consent to proceed.  Participating parties in this visit include: Myself and patient  The patient is: Patient Location: Home I am: Provider Location: Office/Clinic Subjective:    CC and HPI: Darlene Shelton is a 60 y.o. year old female presenting for follow up of BP.  She was unable to pick up her prescriptions for valsartan/HCTZ until Tuesday of last week. She feels like her blood pressures are on the way down, but not consistently enough to see a big difference yet. She has been taking it daily for less than one week.   122/89 yesterday morning. 141/92 this morning at her office.  She does not drink coffee or caffeine.  She is not having any dizzy spells or headaches. No CP or palpitations.  She also tells me her left eye started hurting in the left corner on Tuesday of last week.  She had been working on building a house for her dog over the weekend.  She reports that her eye became red and blood shot looking along the left corner  The pain has since subsided and she reports the redness is appearing to go away.    Past medical history, Surgical history, Family history not pertinant except as noted below, Social history, Allergies, and medications have been entered into the medical record, reviewed, and corrections made.   Review of Systems:  All review of systems negative except what is listed in the HPI  Objective:    Alert and oriented x 4 Speaking in clear sentences with no shortness of  breath. No distress.  Impression and Recommendations:    Problem List Items Addressed This Visit     Essential hypertension - Primary    On new BP meds for less than one week. There is some improvement in BP, but not where we need it to be.  We will plan for her to monitor at home/work for the next two weeks and she will let me know if her numbers are >130/85. If numbers are below goal of 130/85 she will continue to monitor and we will follow-up in 3 months with labs. If her numbers remain elevated or she notices any changes or concerning findings, we will plan to increase the Valsartan component of the medication to achieve goal. F/U in 3 months for in person visit with labs for BP      Irritation of left eye    Symptoms improving. Suspect that irritation and redness due to dust or debris from building products in the eye No longer having pain and redness is resolving. Recommend lubricating eye drops and warm compress to the eye for 20 minutes at a time as needed If symptoms persist or return, I recommend seeing ophthalmology for close examination to ensure that there is no scratch or remaining debris present.        orders and follow up as documented in EMR I discussed the assessment and treatment plan with the patient. The patient was provided an opportunity to ask questions and all were answered. The patient agreed with the  plan and demonstrated an understanding of the instructions.   The patient was advised to call back or seek an in-person evaluation if the symptoms worsen or if the condition fails to improve as anticipated.  Follow-Up: in 3 months  I provided 18 minutes of non-face-to-face interaction with this non face-to-face encounter including intake, same-day documentation, and chart review.   Orma Render, NP , DNP, AGNP-c [facility] [phone] (fax)

## 2020-12-19 NOTE — Assessment & Plan Note (Signed)
Symptoms improving. Suspect that irritation and redness due to dust or debris from building products in the eye No longer having pain and redness is resolving. Recommend lubricating eye drops and warm compress to the eye for 20 minutes at a time as needed If symptoms persist or return, I recommend seeing ophthalmology for close examination to ensure that there is no scratch or remaining debris present.

## 2021-01-19 ENCOUNTER — Other Ambulatory Visit: Payer: Self-pay

## 2021-01-19 ENCOUNTER — Ambulatory Visit
Admission: RE | Admit: 2021-01-19 | Discharge: 2021-01-19 | Disposition: A | Discharge status: Home / Self Care | Payer: No Typology Code available for payment source | Source: Ambulatory Visit | Attending: Nurse Practitioner | Admitting: Nurse Practitioner

## 2021-01-19 DIAGNOSIS — Z1231 Encounter for screening mammogram for malignant neoplasm of breast: Secondary | ICD-10-CM

## 2021-01-30 ENCOUNTER — Ambulatory Visit (HOSPITAL_BASED_OUTPATIENT_CLINIC_OR_DEPARTMENT_OTHER): Payer: No Typology Code available for payment source

## 2021-02-06 ENCOUNTER — Ambulatory Visit (HOSPITAL_BASED_OUTPATIENT_CLINIC_OR_DEPARTMENT_OTHER): Payer: No Typology Code available for payment source

## 2021-03-21 ENCOUNTER — Ambulatory Visit (HOSPITAL_BASED_OUTPATIENT_CLINIC_OR_DEPARTMENT_OTHER): Payer: No Typology Code available for payment source | Admitting: Nurse Practitioner

## 2021-03-26 ENCOUNTER — Ambulatory Visit (HOSPITAL_BASED_OUTPATIENT_CLINIC_OR_DEPARTMENT_OTHER): Payer: No Typology Code available for payment source | Admitting: Nurse Practitioner

## 2021-04-06 ENCOUNTER — Encounter (HOSPITAL_BASED_OUTPATIENT_CLINIC_OR_DEPARTMENT_OTHER): Payer: Self-pay | Admitting: Nurse Practitioner

## 2021-06-14 ENCOUNTER — Other Ambulatory Visit (HOSPITAL_COMMUNITY): Payer: Self-pay

## 2021-09-11 ENCOUNTER — Other Ambulatory Visit (HOSPITAL_COMMUNITY): Payer: Self-pay

## 2022-01-19 IMAGING — MG MM DIGITAL SCREENING BILAT W/ TOMO AND CAD
8 series · 8 of 24 positions shown · non-contrast
Comparison: Previous exam(s).

CLINICAL DATA: Screening.

EXAM:
DIGITAL SCREENING BILATERAL MAMMOGRAM WITH TOMOSYNTHESIS AND CAD
TECHNIQUE: Bilateral screening digital craniocaudal and mediolateral oblique
mammograms were obtained. Bilateral screening digital breast
tomosynthesis was performed. The images were evaluated with
computer-aided detection.

[L MLO synth-2D]
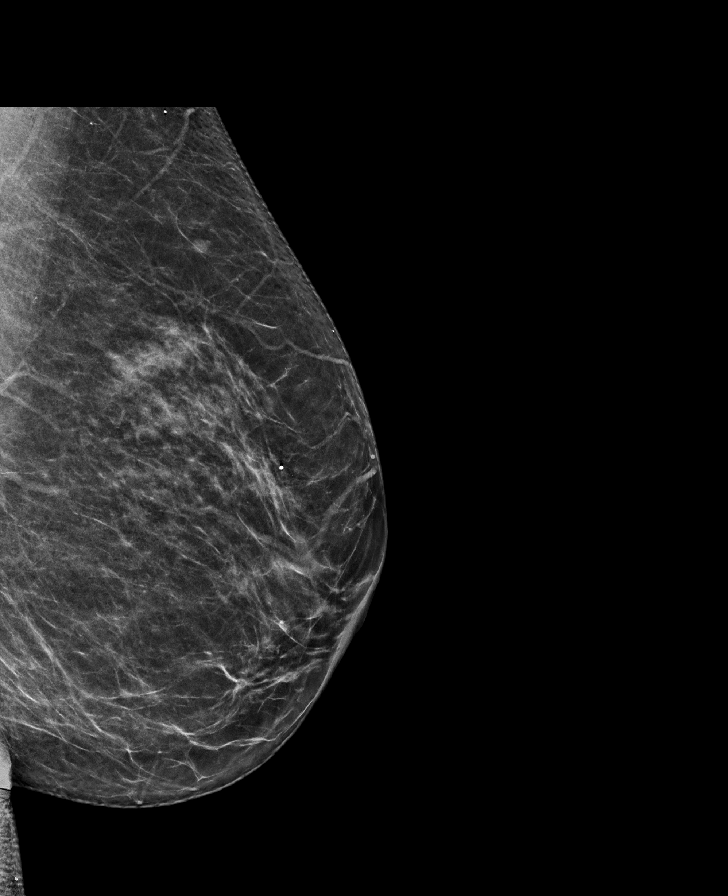

[R CC synth-2D]
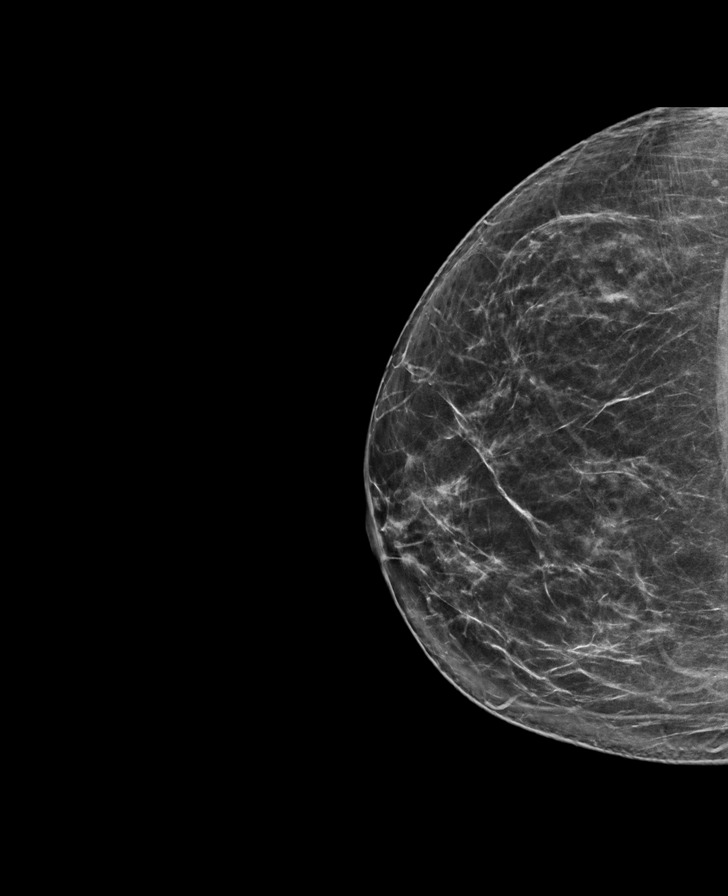

[L CC synth-2D]
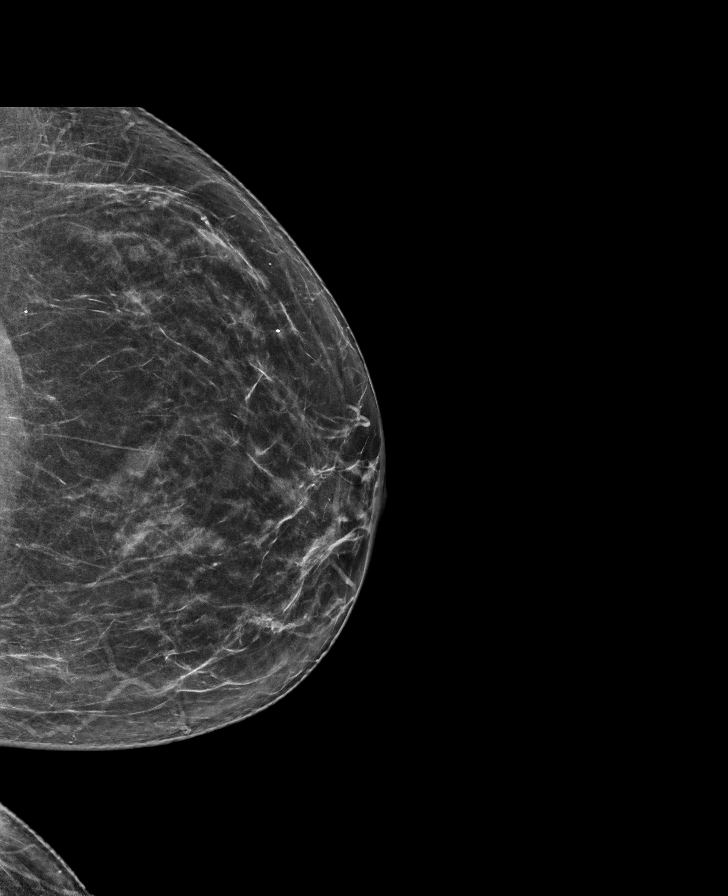

[R MLO synth-2D]
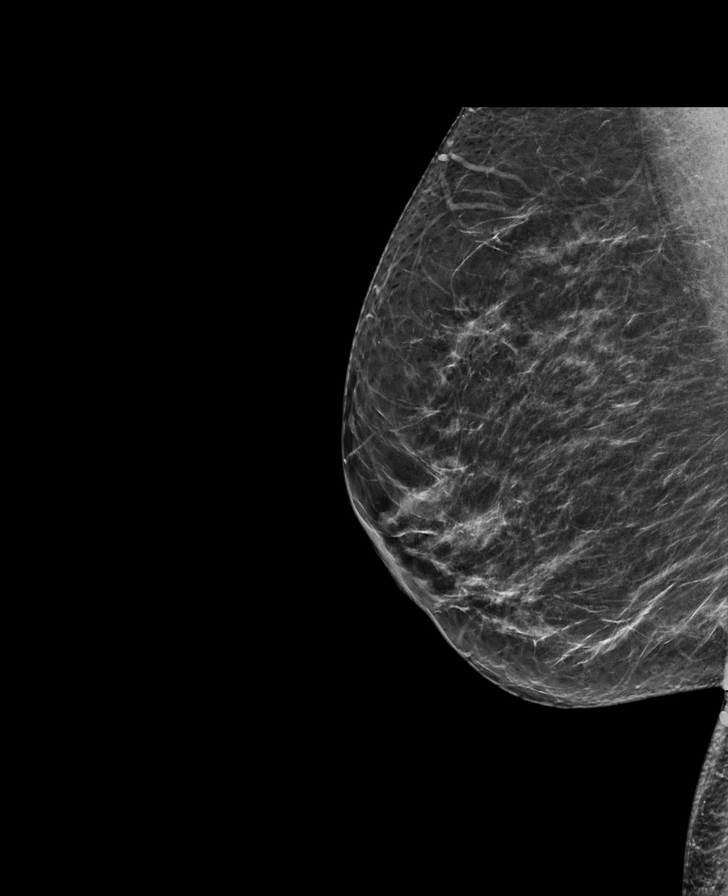

[L MLO tomo · tomo slice 33/66.0]
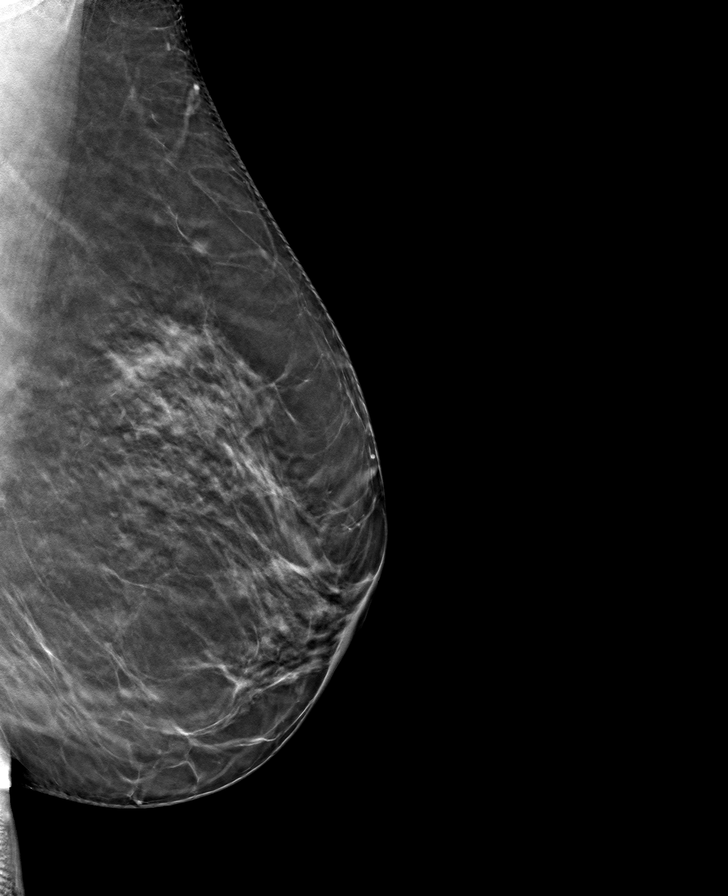

[R MLO tomo · tomo slice 34/67.0]
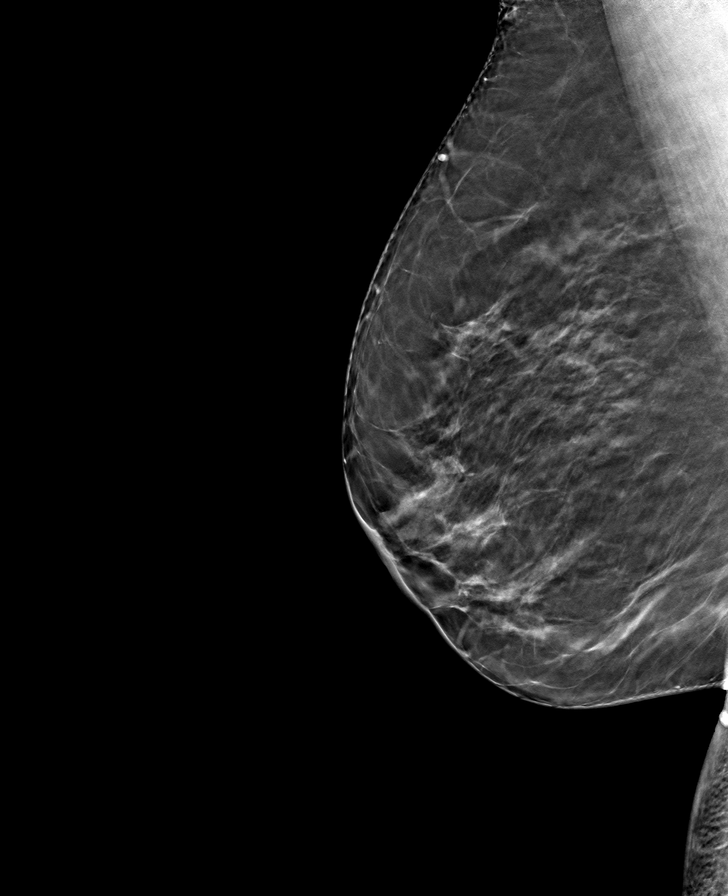

[R CC tomo · tomo slice 37/74.0]
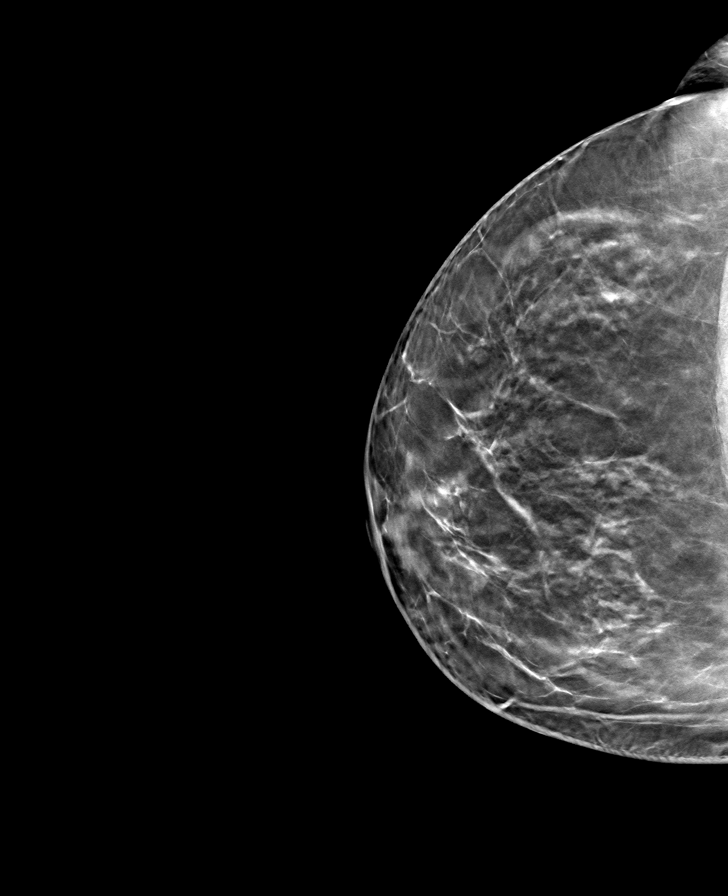

[L CC tomo · tomo slice 35/70.0]
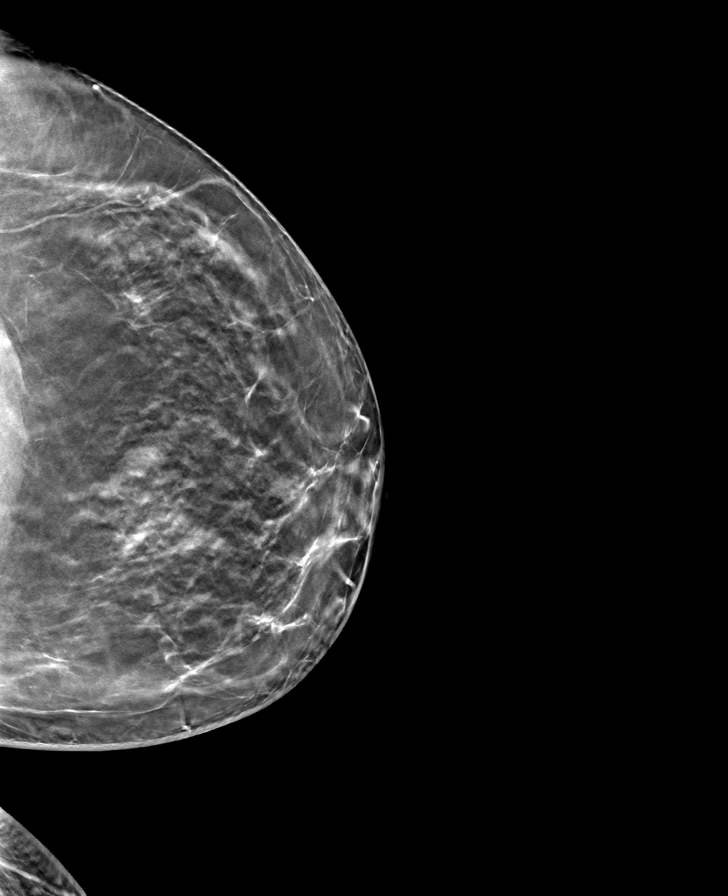

[8 of 24 positions shown; findings below may reference images not displayed]

ACR Breast Density Category b: There are scattered areas of
fibroglandular density.
FINDINGS: There are no findings suspicious for malignancy.
IMPRESSION: No mammographic evidence of malignancy. A result letter of this
screening mammogram will be mailed directly to the patient.

RECOMMENDATION:
Screening mammogram in one year. (Code:51-O-LD2)

BI-RADS CATEGORY  1: Negative.

## 2022-05-07 ENCOUNTER — Encounter: Payer: Self-pay | Admitting: Nurse Practitioner

## 2022-05-07 ENCOUNTER — Ambulatory Visit (INDEPENDENT_AMBULATORY_CARE_PROVIDER_SITE_OTHER): Payer: 59 | Admitting: Nurse Practitioner

## 2022-05-07 ENCOUNTER — Other Ambulatory Visit (HOSPITAL_COMMUNITY): Payer: Self-pay

## 2022-05-07 VITALS — BP 132/80 | HR 106 | Wt 195.2 lb

## 2022-05-07 DIAGNOSIS — S3992XA Unspecified injury of lower back, initial encounter: Secondary | ICD-10-CM | POA: Diagnosis not present

## 2022-05-07 MED ORDER — HYDROCODONE-ACETAMINOPHEN 10-325 MG PO TABS
1.0000 | ORAL_TABLET | Freq: Three times a day (TID) | ORAL | 0 refills | Status: AC | PRN
  Filled 2022-05-07: qty 32, 6d supply, fill #0

## 2022-05-07 NOTE — Progress Notes (Unsigned)
Orma Render, DNP, AGNP-c [facility] [address] [place], [place] [address] [phone]  Subjective:   Darlene Shelton is a 62 y.o. female presents to day for evaluation of:  Darlene Shelton presents today with a chief complaint of worsening pain in the coccyx area following a fall. She describes difficulty in putting on socks and pants due to the discomfort. The pain is localized in the lower back and upper buttocks area, with tenderness that extends into the buttocks. Darlene Shelton denies any urinary or bowel incontinence or lower extremity weakness and has been self-managing with ibuprofen for pain relief.  Furthermore, Darlene Shelton reports that lying down at night does not provide pain relief, and she experiences difficulty when getting out of bed in the morning. She has not attempted icing the area but is open to trying ice or heat applications for pain relief. She occasionally experiences an increase in pain during bowel movements, though this is not a consistent symptom. Darlene Shelton denies any associated weakness or tingling in her legs.  In addition to her coccyx pain, Darlene Shelton expresses a desire to lose weight and acknowledges the challenges associated with weight loss as she ages. She is willing to undergo diagnostic imaging to determine the cause of her pain and is actively seeking options for pain managemen  PMH, Medications, and Allergies reviewed and updated in chart as appropriate.   ROS negative except for what is listed in HPI. Objective:  BP 132/80   Pulse (!) 106   Wt 195 lb 3.2 oz (88.5 kg)   LMP 03/05/2011 (Approximate)   BMI 33.51 kg/m  Physical Exam Vitals and nursing note reviewed.  Constitutional:      General: She is not in acute distress.    Appearance: Normal appearance.  HENT:     Head: Normocephalic.  Eyes:     Extraocular Movements: Extraocular movements intact.     Conjunctiva/sclera: Conjunctivae normal.     Pupils: Pupils are equal, round, and reactive  to light.  Neck:     Vascular: No carotid bruit.  Cardiovascular:     Rate and Rhythm: Normal rate and regular rhythm.     Pulses: Normal pulses.     Heart sounds: Normal heart sounds. No murmur heard. Pulmonary:     Effort: Pulmonary effort is normal.     Breath sounds: Normal breath sounds. No wheezing.  Abdominal:     General: Bowel sounds are normal. There is no distension.     Palpations: Abdomen is soft.     Tenderness: There is no abdominal tenderness. There is no guarding.  Musculoskeletal:        General: Tenderness present.     Cervical back: Normal range of motion and neck supple.     Lumbar back: Tenderness and bony tenderness present. Decreased range of motion.     Right lower leg: No edema.     Left lower leg: No edema.  Lymphadenopathy:     Cervical: No cervical adenopathy.  Skin:    General: Skin is warm and dry.     Capillary Refill: Capillary refill takes less than 2 seconds.  Neurological:     General: No focal deficit present.     Mental Status: She is alert and oriented to person, place, and time.  Psychiatric:        Mood and Affect: Mood normal.        Behavior: Behavior normal.        Thought Content: Thought content normal.  Judgment: Judgment normal.           Assessment & Plan:   Problem List Items Addressed This Visit     Injury of coccyx - Primary    The patient presents with a coccyx injury accompanied by lower back pain, which is impacting daily activities. An x-ray is necessary to determine the presence of a fracture or dislocation. Plan: - Order coccyx x-ray at [facility] to confirm the diagnosis. - Advise the use of a donut cushion or soft pillow for sitting to alleviate pressure on the coccyx. - Recommend applying ice packs or frozen vegetables to reduce inflammation. - Consider the application of heat for pain relief as an alternative to cold therapy. - Prescribe pain medication for five days, with dosing instructions  of one to two tablets as needed. If pain medication is not frequently required, suggest breaking a tablet in half. - Monitor the patient's condition and instruct to report any worsening or new symptoms.       Relevant Medications   HYDROcodone-acetaminophen (NORCO) 10-325 MG tablet   Other Relevant Orders   DG Sacrum/Coccyx      Orma Render, DNP, AGNP-c 05/09/2022  7:57 PM    History, Medications, Surgery, SDOH, and Family History reviewed and updated as appropriate.

## 2022-05-09 ENCOUNTER — Ambulatory Visit
Admission: RE | Admit: 2022-05-09 | Discharge: 2022-05-09 | Disposition: A | Discharge status: Home / Self Care | Payer: 59 | Source: Ambulatory Visit | Attending: Nurse Practitioner | Admitting: Nurse Practitioner

## 2022-05-09 DIAGNOSIS — S3992XA Unspecified injury of lower back, initial encounter: Secondary | ICD-10-CM

## 2022-05-09 DIAGNOSIS — M533 Sacrococcygeal disorders, not elsewhere classified: Secondary | ICD-10-CM | POA: Diagnosis not present

## 2022-05-09 NOTE — Assessment & Plan Note (Signed)
The patient presents with a coccyx injury accompanied by lower back pain, which is impacting daily activities. An x-ray is necessary to determine the presence of a fracture or dislocation. Plan: - Order coccyx x-ray at [facility] to confirm the diagnosis. - Advise the use of a donut cushion or soft pillow for sitting to alleviate pressure on the coccyx. - Recommend applying ice packs or frozen vegetables to reduce inflammation. - Consider the application of heat for pain relief as an alternative to cold therapy. - Prescribe pain medication for five days, with dosing instructions of one to two tablets as needed. If pain medication is not frequently required, suggest breaking a tablet in half. - Monitor the patient's condition and instruct to report any worsening or new symptoms.

## 2022-10-24 ENCOUNTER — Encounter (HOSPITAL_BASED_OUTPATIENT_CLINIC_OR_DEPARTMENT_OTHER): Payer: Self-pay | Admitting: Family Medicine

## 2022-10-24 ENCOUNTER — Telehealth (HOSPITAL_BASED_OUTPATIENT_CLINIC_OR_DEPARTMENT_OTHER): Payer: Self-pay | Admitting: Family Medicine

## 2022-10-24 ENCOUNTER — Ambulatory Visit (INDEPENDENT_AMBULATORY_CARE_PROVIDER_SITE_OTHER): Payer: 59 | Admitting: Family Medicine

## 2022-10-24 ENCOUNTER — Other Ambulatory Visit (HOSPITAL_COMMUNITY): Payer: Self-pay

## 2022-10-24 VITALS — BP 176/94 | HR 77 | Ht 64.02 in | Wt 186.4 lb

## 2022-10-24 DIAGNOSIS — Z0000 Encounter for general adult medical examination without abnormal findings: Secondary | ICD-10-CM | POA: Diagnosis not present

## 2022-10-24 DIAGNOSIS — Z1231 Encounter for screening mammogram for malignant neoplasm of breast: Secondary | ICD-10-CM | POA: Diagnosis not present

## 2022-10-24 DIAGNOSIS — Z1211 Encounter for screening for malignant neoplasm of colon: Secondary | ICD-10-CM

## 2022-10-24 DIAGNOSIS — E785 Hyperlipidemia, unspecified: Secondary | ICD-10-CM

## 2022-10-24 DIAGNOSIS — I10 Essential (primary) hypertension: Secondary | ICD-10-CM | POA: Diagnosis not present

## 2022-10-24 DIAGNOSIS — Z23 Encounter for immunization: Secondary | ICD-10-CM | POA: Diagnosis not present

## 2022-10-24 DIAGNOSIS — H6192 Disorder of left external ear, unspecified: Secondary | ICD-10-CM | POA: Diagnosis not present

## 2022-10-24 MED ORDER — VALSARTAN-HYDROCHLOROTHIAZIDE 320-25 MG PO TABS
1.0000 | ORAL_TABLET | Freq: Every day | ORAL | 3 refills | Status: DC
  Filled 2022-10-24 – 2022-11-11 (×2): qty 90, 90d supply, fill #0
  Filled 2023-03-20: qty 90, 90d supply, fill #1

## 2022-10-24 MED ORDER — ROSUVASTATIN CALCIUM 5 MG PO TABS
5.0000 mg | ORAL_TABLET | Freq: Every day | ORAL | 3 refills | Status: DC
  Filled 2022-10-24 – 2022-11-11 (×2): qty 90, 90d supply, fill #0
  Filled 2023-03-20: qty 90, 90d supply, fill #1

## 2022-10-24 NOTE — Progress Notes (Signed)
Subjective:   Darlene Shelton May 06, 1960  10/24/2022   CC: No chief complaint on file.   HPI: Darlene Shelton is a 62 y.o. female who presents for a routine health maintenance exam.  Labs collected at time of visit.    HYPERTENSION: Darlene Shelton presents for the medical management of hypertension.  Patient's current hypertension medication regimen is: Diovan 320-35mg  - last taken on "Sunday  Patient is not currently taking prescribed medications regularly for HTN.  Patient is not regularly keeping a check on BP at home.  Adhering to low sodium diet: Yes  Exercising Regularly: Yes Denies headache, dizziness, CP, SHOB, vision changes.    BP Readings from Last 3 Encounters:  10/24/22 (!) 176/94  05/07/22 132/80  12/19/20 (!) 141/92   HYPERLIPIDEMIA: Darlene Shelton presents for the medical management of hyperlipidemia.  Patient's current HLD regimen is: Crestor 5mg (last taken Sunday)  Patient is not regularly taking prescribed medications for HLD.  Adhering to heathy diet: Yes Exercising regularly: Yes Denies myalgias.  Lab Results  Component Value Date   CHOL 217 (H) 12/05/2020   HDL 57 12/05/2020   LDLCALC 137 (H) 12/05/2020   TRIG 129 12/05/2020   CHOLHDL 3.8 12/05/2020   The 10-year ASCVD risk score (Arnett DK, et al., 2019) is: 10.5%   Values used to calculate the score:     Age: 61 years     Sex: Female     Is Non-Hispanic African American: No     Diabetic: No     Tobacco smoker: No     Systolic Blood Pressure: 176 mmHg     Is BP treated: Yes     HDL Cholesterol: 57 mg/dL     Total Cholesterol: 217 mg/dL   HEALTH SCREENINGS: - Vision Screening:  Recommend eye exam  - Dental Visits:  Recommended  - Pap smear:  Will schedule with Dr. Miller  - Breast Exam:  Will schedule with Dr. Miller - STD Screening: Declined - Mammogram (40+): Ordered today  - Colonoscopy (45+): Ordered today  - Bone Density (65+ or under 65 with predisposing  conditions): Refused  - Lung CA screening with low-dose CT:  Not applicable Adults age 50-80 who are current cigarette smokers or quit within the last 15 years. Must have 20 pack year history.   Depression and Anxiety Screen done today and results listed below:     12/05/2020    8:48 AM  Depression screen PHQ 2/9  Decreased Interest 1  Down, Depressed, Hopeless 1  PHQ - 2 Score 2  Altered sleeping 0  Tired, decreased energy 1  Change in appetite 1  Feeling bad or failure about yourself  1  Trouble concentrating 1  Moving slowly or fidgety/restless 0  Suicidal thoughts 0  PHQ-9 Score 6  Difficult doing work/chores Somewhat difficult      10" /06/2020    8:49 AM  GAD 7 : Generalized Anxiety Score  Nervous, Anxious, on Edge 1  Control/stop worrying 1  Worry too much - different things 1  Trouble relaxing 1  Restless 1  Easily annoyed or irritable 1  Afraid - awful might happen 1  Total GAD 7 Score 7  Anxiety Difficulty Somewhat difficult    IMMUNIZATIONS: - Tdap: Tetanus vaccination status reviewed: Td vaccination indicated and given today. - HPV: Not applicable - Influenza: Postponed to flu season - Pneumovax: Not applicable - Prevnar 20: Not applicable - Zostavax (50+):  Recommended to obtain at local pharmacy  Past medical history, surgical history, medications, allergies, family history and social history reviewed with patient today and changes made to appropriate areas of the chart.   Past Medical History:  Diagnosis Date   Abnormal Pap smear    h/o LEEP 12/11   Anxiety    Elevated lipids    Fibroids    Hypertension     Past Surgical History:  Procedure Laterality Date   DILATION AND CURETTAGE OF UTERUS  1992   LEEP  02/2010   negative margins   TONSILLECTOMY      No current outpatient medications on file prior to visit.   No current facility-administered medications on file prior to visit.    No Known Allergies   Social History    Socioeconomic History   Marital status: Married    Spouse name: Not on file   Number of children: Not on file   Years of education: Not on file   Highest education level: 12th grade  Occupational History   Not on file  Tobacco Use   Smoking status: Never   Smokeless tobacco: Never  Vaping Use   Vaping status: Never Used  Substance and Sexual Activity   Alcohol use: No   Drug use: No   Sexual activity: Yes    Partners: Male    Birth control/protection: Post-menopausal  Other Topics Concern   Not on file  Social History Narrative   Not on file   Social Determinants of Health   Financial Resource Strain: Low Risk  (10/23/2022)   Overall Financial Resource Strain (CARDIA)    Difficulty of Paying Living Expenses: Not hard at all  Food Insecurity: No Food Insecurity (10/23/2022)   Hunger Vital Sign    Worried About Running Out of Food in the Last Year: Never true    Ran Out of Food in the Last Year: Never true  Transportation Needs: No Transportation Needs (10/23/2022)   PRAPARE - Administrator, Civil Service (Medical): No    Lack of Transportation (Non-Medical): No  Physical Activity: Insufficiently Active (10/23/2022)   Exercise Vital Sign    Days of Exercise per Week: 4 days    Minutes of Exercise per Session: 30 min  Stress: No Stress Concern Present (10/23/2022)   [employer] of Occupational Health - Occupational Stress Questionnaire    Feeling of Stress : Not at all  Social Connections: Unknown (10/23/2022)   Social Connection and Isolation Panel [NHANES]    Frequency of Communication with Friends and Family: Three times a week    Frequency of Social Gatherings with Friends and Family: Patient declined    Attends Religious Services: Patient declined    Database administrator or Organizations: No    Attends Engineer, structural: Not on file    Marital Status: Married  Catering manager Violence: Not on file   Social History   Tobacco Use   Smoking Status Never  Smokeless Tobacco Never   Social History   Substance and Sexual Activity  Alcohol Use No    Family History  Problem Relation Age of Onset   Hypertension Mother    Hypertension Father    Hearing loss Father    Hypertension Sister    Diabetes Maternal Grandfather    Stroke Paternal Grandmother    Heart attack Paternal Grandmother        and strokes   Heart disease Paternal Grandfather    Colon cancer Neg Hx      ROS: Denies  fever, fatigue, unexplained weight loss/gain, chest pain, SHOB, and palpitations. Denies neurological deficits, gastrointestinal or genitourinary complaints, and skin changes.   Objective:   Today's Vitals   10/24/22 1603 10/24/22 1620  BP: (!) 188/92 (!) 176/94  Pulse: 77   SpO2: 100%   Weight: 186 lb 6.4 oz (84.6 kg)   Height: 5' 4.02" (1.626 m)     GENERAL APPEARANCE: Well-appearing, in NAD. Well nourished.  SKIN: Pink, warm and dry. Turgor normal. No rash, lesion, ulceration, or ecchymoses. Hair evenly distributed.  Red papular lesion approximately 0.25 cm present to left external ear.  Site is mildly tender to palpation, no drainage or bleeding.  (Patient reports this is the ear that she routinely holds phone to throughout the day for her work) HEENT: HEAD: Normocephalic.  EYES: PERRLA. EOMI. Lids intact w/o defect. Sclera white, Conjunctiva pink w/o exudate.  EARS: External ear w/o redness, swelling, masses or lesions. EAC clear. TM's intact, translucent w/o bulging, appropriate landmarks visualized. Appropriate acuity to conversational tones.  NOSE: Septum midline w/o deformity. Nares patent, mucosa pink and non-inflamed w/o drainage. No sinus tenderness.  THROAT: Uvula midline. Oropharynx clear. Tonsils non-inflamed w/o exudate. Oral mucosa pink and moist.  NECK: Supple, Trachea midline. Full ROM w/o pain or tenderness. No lymphadenopathy. Thyroid non-tender w/o enlargement or palpable masses.  RESPIRATORY: Chest wall  symmetrical w/o masses. Respirations even and non-labored. Breath sounds clear to auscultation bilaterally. No wheezes, rales, rhonchi, or crackles. CARDIAC: S1, S2 present, regular rate and rhythm. No gallops, murmurs, rubs, or clicks. PMI w/o lifts, heaves, or thrills. No carotid bruits. Capillary refill <2 seconds. Peripheral pulses 2+ bilaterally. GI: Abdomen soft w/o distention. Normoactive bowel sounds. No palpable masses or tenderness. No guarding or rebound tenderness. Liver and spleen w/o tenderness or enlargement. No CVA tenderness.  MSK: Muscle tone and strength appropriate for age, w/o atrophy or abnormal movement.  EXTREMITIES: Active ROM intact, w/o tenderness, crepitus, or contracture. No obvious joint deformities or effusions. No clubbing, edema, or cyanosis.  NEUROLOGIC: CN's II-XII intact. Motor strength symmetrical with no obvious weakness. No sensory deficits. DTR's 2+ symmetric bilaterally. Steady, even gait.  PSYCH/MENTAL STATUS: Alert, oriented x 3. Cooperative, appropriate mood and affect.     Assessment & Plan:  1. Annual physical exam Doing well overall.  Will obtain her lab work today for annual exam. - Lipid panel - Comprehensive metabolic panel - CBC with Differential/Platelet - Hemoglobin A1c - TSH  2. Breast cancer screening by mammogram 3D mammogram ordered to be completed at breast center [facility] - MM 3D SCREENING MAMMOGRAM BILATERAL BREAST; Future  3. Screening for colon cancer Referral placed to [facility] for routine colonoscopy.  Has been 10 years since her last C-scope - Ambulatory referral to Gastroenterology  4. Essential hypertension Patient to take her Diovan regularly.  Did not have medication for the past several days.  BP log given to patient and recommended to log at least twice a week.  She will return in 4 weeks for blood pressure check. - valsartan-hydrochlorothiazide (DIOVAN-HCT) 320-25 MG tablet; Take 1 tablet by mouth daily.   Dispense: 90 tablet; Refill: 3  5. Hyperlipidemia, unspecified hyperlipidemia type Crestor refilled and recommended patient to start taking this on a regular basis nightly.  Will check lipid panel today and titrate as needed.  Recommended good diet and exercise as well. - rosuvastatin (CRESTOR) 5 MG tablet; Take 1 tablet (5 mg total) by mouth at bedtime.  Dispense: 90 tablet; Refill: 3  6. Skin  lesion of left ear Concern for nonhealing lesion and need for dermatology evaluation and possible biopsy. - Ambulatory referral to Dermatology   Orders Placed This Encounter  Procedures   MM 3D SCREENING MAMMOGRAM BILATERAL BREAST    Standing Status:   Future    Standing Expiration Date:   10/24/2023    Order Specific Question:   Reason for Exam (SYMPTOM  OR DIAGNOSIS REQUIRED)    Answer:   Screening for Mammogram    Order Specific Question:   Preferred imaging location?    Answer:   [facility]   Tdap vaccine greater than or equal to 7yo IM   Lipid panel   Comprehensive metabolic panel   CBC with Differential/Platelet   Hemoglobin A1c   TSH   Ambulatory referral to Gastroenterology    Referral Priority:   Routine    Referral Type:   Consultation    Referral Reason:   Specialty Services Required    Number of Visits Requested:   1   Ambulatory referral to Dermatology    Referral Priority:   Routine    Referral Type:   Consultation    Referral Reason:   Specialty Services Required    Requested Specialty:   Dermatology    Number of Visits Requested:   1    PATIENT COUNSELING:  - Encouraged a healthy well-balanced diet. Patient may adjust caloric intake to maintain or achieve ideal body weight. May reduce intake of dietary saturated fat and total fat and have adequate dietary potassium and calcium preferably from fresh fruits, vegetables, and low-fat dairy products.   - Advised to avoid cigarette smoking. - Discussed with the patient that most people either abstain from alcohol or  drink within safe limits (<=14/week and <=4 drinks/occasion for males, <=7/weeks and <= 3 drinks/occasion for females) and that the risk for alcohol disorders and other health effects rises proportionally with the number of drinks per week and how often a drinker exceeds daily limits. - Discussed cessation/primary prevention of drug use and availability of treatment for abuse.  - Discussed sexually transmitted diseases, avoidance of unintended pregnancy and contraceptive alternatives.  - Stressed the importance of regular exercise - Injury prevention: Discussed safety belts, safety helmets, smoke detector, smoking near bedding or upholstery.  - Dental health: Discussed importance of regular tooth brushing, flossing, and dental visits.   NEXT PREVENTATIVE PHYSICAL DUE IN 1 YEAR.  Return in about 4 weeks (around 11/21/2022) for HYPERTENSION nurse visit.  Patient to reach out to office if new, worrisome, or unresolved symptoms arise or if no improvement in patient's condition. Patient verbalized understanding and is agreeable to treatment plan. All questions answered to patient's satisfaction.    Yolanda Manges, FNP

## 2022-10-24 NOTE — Patient Instructions (Addendum)
Please schedule your pap smear with Dr. Rondel Baton office.  [phone]  Schedule your mammogram and colonoscopy.    Monitor your blood pressure at least 2x a week and keep a log for the next month.

## 2022-10-24 NOTE — Telephone Encounter (Signed)
error 

## 2022-10-25 ENCOUNTER — Encounter (HOSPITAL_BASED_OUTPATIENT_CLINIC_OR_DEPARTMENT_OTHER): Payer: Self-pay | Admitting: Family Medicine

## 2022-10-25 ENCOUNTER — Other Ambulatory Visit (HOSPITAL_COMMUNITY): Payer: Self-pay

## 2022-10-25 LAB — COMPREHENSIVE METABOLIC PANEL
ALT: 23 IU/L (ref 0–32)
AST: 28 IU/L (ref 0–40)
Albumin: 4.5 g/dL (ref 3.9–4.9)
Alkaline Phosphatase: 95 IU/L (ref 44–121)
BUN/Creatinine Ratio: 21 (ref 12–28)
BUN: 16 mg/dL (ref 8–27)
Bilirubin Total: 0.7 mg/dL (ref 0.0–1.2)
CO2: 23 mmol/L (ref 20–29)
Calcium: 9.7 mg/dL (ref 8.7–10.3)
Chloride: 102 mmol/L (ref 96–106)
Creatinine, Ser: 0.77 mg/dL (ref 0.57–1.00)
Globulin, Total: 2.3 g/dL (ref 1.5–4.5)
Glucose: 93 mg/dL (ref 70–99)
Potassium: 3.5 mmol/L (ref 3.5–5.2)
Sodium: 141 mmol/L (ref 134–144)
Total Protein: 6.8 g/dL (ref 6.0–8.5)
eGFR: 87 mL/min/{1.73_m2} (ref >59)

## 2022-10-25 LAB — CBC WITH DIFFERENTIAL/PLATELET
Basophils Absolute: 0 10*3/uL (ref 0.0–0.2)
Basos: 1 %
EOS (ABSOLUTE): 0.1 10*3/uL (ref 0.0–0.4)
Eos: 1 %
Hematocrit: 38.8 % (ref 34.0–46.6)
Hemoglobin: 13.2 g/dL (ref 11.1–15.9)
Immature Grans (Abs): 0 10*3/uL (ref 0.0–0.1)
Immature Granulocytes: 0 %
Lymphocytes Absolute: 2.6 10*3/uL (ref 0.7–3.1)
Lymphs: 36 %
MCH: 30.1 pg (ref 26.6–33.0)
MCHC: 34 g/dL (ref 31.5–35.7)
MCV: 89 fL (ref 79–97)
Monocytes Absolute: 0.3 10*3/uL (ref 0.1–0.9)
Monocytes: 5 %
Neutrophils Absolute: 4.1 10*3/uL (ref 1.4–7.0)
Neutrophils: 57 %
Platelets: 223 10*3/uL (ref 150–450)
RBC: 4.38 x10E6/uL (ref 3.77–5.28)
RDW: 12.4 % (ref 11.7–15.4)
WBC: 7.1 10*3/uL (ref 3.4–10.8)

## 2022-10-25 LAB — LIPID PANEL
Chol/HDL Ratio: 3.4 ratio (ref 0.0–4.4)
Cholesterol, Total: 193 mg/dL (ref 100–199)
HDL: 57 mg/dL (ref >39)
LDL Chol Calc (NIH): 116 mg/dL — ABNORMAL HIGH (ref 0–99)
Triglycerides: 112 mg/dL (ref 0–149)
VLDL Cholesterol Cal: 20 mg/dL (ref 5–40)

## 2022-10-25 LAB — HEMOGLOBIN A1C
Est. average glucose Bld gHb Est-mCnc: 123 mg/dL
Hgb A1c MFr Bld: 5.9 % — ABNORMAL HIGH (ref 4.8–5.6)

## 2022-10-25 LAB — TSH: TSH: 2.24 u[IU]/mL (ref 0.450–4.500)

## 2022-10-25 NOTE — Progress Notes (Signed)
Hi Alvenia, Overall your labs look good.  Cholesterol was improved from last year, your electrolytes kidneys and liver function are normal.  Your thyroid was normal and your blood counts were normal.  However your A1c was 5.9 which is in the prediabetic range.  I would recommend dietary changes first with decrease of sugars, carbohydrates like breads pastas and fast food.  The alternative would be to start metformin which is an oral medication on a daily basis.  If you would like to do this we would be happy to send this in for you.  I would recommend that we get this rechecked in 6 months for you as well.  Please let me know your thoughts

## 2022-11-06 ENCOUNTER — Other Ambulatory Visit (HOSPITAL_COMMUNITY): Payer: Self-pay

## 2022-11-11 ENCOUNTER — Other Ambulatory Visit (HOSPITAL_COMMUNITY): Payer: Self-pay

## 2022-11-12 ENCOUNTER — Other Ambulatory Visit (HOSPITAL_COMMUNITY): Payer: Self-pay

## 2022-11-21 ENCOUNTER — Ambulatory Visit (HOSPITAL_BASED_OUTPATIENT_CLINIC_OR_DEPARTMENT_OTHER): Payer: 59

## 2022-11-26 ENCOUNTER — Ambulatory Visit (HOSPITAL_BASED_OUTPATIENT_CLINIC_OR_DEPARTMENT_OTHER): Payer: 59

## 2022-12-03 ENCOUNTER — Ambulatory Visit (HOSPITAL_BASED_OUTPATIENT_CLINIC_OR_DEPARTMENT_OTHER): Payer: 59 | Admitting: Obstetrics & Gynecology

## 2022-12-03 ENCOUNTER — Other Ambulatory Visit (HOSPITAL_COMMUNITY)
Admission: RE | Admit: 2022-12-03 | Discharge: 2022-12-03 | Disposition: A | Discharge status: Home / Self Care | Payer: 59 | Source: Ambulatory Visit | Attending: Obstetrics & Gynecology | Admitting: Obstetrics & Gynecology

## 2022-12-03 ENCOUNTER — Encounter (HOSPITAL_BASED_OUTPATIENT_CLINIC_OR_DEPARTMENT_OTHER): Payer: Self-pay | Admitting: Obstetrics & Gynecology

## 2022-12-03 VITALS — BP 161/90 | HR 81 | Ht 65.0 in | Wt 183.2 lb

## 2022-12-03 DIAGNOSIS — I10 Essential (primary) hypertension: Secondary | ICD-10-CM | POA: Diagnosis not present

## 2022-12-03 DIAGNOSIS — Z780 Asymptomatic menopausal state: Secondary | ICD-10-CM

## 2022-12-03 DIAGNOSIS — Z01419 Encounter for gynecological examination (general) (routine) without abnormal findings: Secondary | ICD-10-CM

## 2022-12-03 DIAGNOSIS — N6314 Unspecified lump in the right breast, lower inner quadrant: Secondary | ICD-10-CM

## 2022-12-03 DIAGNOSIS — Z124 Encounter for screening for malignant neoplasm of cervix: Secondary | ICD-10-CM | POA: Diagnosis not present

## 2022-12-03 NOTE — Patient Instructions (Signed)
Non hormonal  Coconut oil twice weekly Replens vaginal moisturizer twice weekly Revaree - vaginal hyaluronic acid suppository twice weekly Gynetroph (sp?)  on [employer].  Vit E and hyaluronic acid   Hormonal  Estradiol cream -- twice weekly (prescription)

## 2022-12-03 NOTE — Progress Notes (Signed)
62 y.o. G3P2 Married White or Caucasian female here for annual exam.  Lots of stressors with work.  Denies vaginal bleeding.  Doesn't have hot flashes or night sweats.  She does have vaginal dryness.   Blood pressure elevated today.  Pt hasn't taken her anti-hypertensive this morning.    Patient's last menstrual period was 03/05/2011 (approximate).          Sexually active: Yes.    The current method of family planning is post menopausal status.    Smoker:  no  Health Maintenance: Pap:  05/01/2018 Negative History of abnormal Pap:  yes MMG:  01/19/2021 Negative.  Scheduled 10/3.   Colonoscopy:  10/30/2012.  Follow up 10 years.  Pt aware.   BMD:   will order for next year Screening Labs: 10/2022   reports that she has never smoked. She has never used smokeless tobacco. She reports that she does not drink alcohol and does not use drugs.  Past Medical History:  Diagnosis Date   Abnormal Pap smear    h/o LEEP 12/11   Anxiety    Elevated lipids    Fibroids    Hypertension     Past Surgical History:  Procedure Laterality Date   DILATION AND CURETTAGE OF UTERUS  1992   LEEP  02/2010   negative margins   TONSILLECTOMY      Current Outpatient Medications  Medication Sig Dispense Refill   rosuvastatin (CRESTOR) 5 MG tablet Take 1 tablet (5 mg total) by mouth at bedtime. 90 tablet 3   valsartan-hydrochlorothiazide (DIOVAN-HCT) 320-25 MG tablet Take 1 tablet by mouth daily. (Patient not taking: Reported on 12/03/2022) 90 tablet 3   No current facility-administered medications for this visit.    Family History  Problem Relation Age of Onset   Hypertension Mother    Hypertension Father    Hearing loss Father    Hypertension Sister    Diabetes Maternal Grandfather    Stroke Paternal Grandmother    Heart attack Paternal Grandmother        and strokes   Heart disease Paternal Grandfather    Colon cancer Neg Hx     ROS: Constitutional:  negative Genitourinary:negative  Exam:   BP (!) 161/100 (BP Location: Left Arm, Patient Position: Sitting, Cuff Size: Large) Comment: Patient has not taken medication regularly  Pulse 82   Ht 5\' 5"  (1.651 m) Comment: Reported  Wt 183 lb 3.2 oz (83.1 kg)   LMP 03/05/2011 (Approximate)   BMI 30.49 kg/m   Height: 5\' 5"  (165.1 cm) (Reported)  General appearance: alert, cooperative and appears stated age Head: Normocephalic, without obvious abnormality, atraumatic Neck: no adenopathy, supple, symmetrical, trachea midline and thyroid normal to inspection and palpation Lungs: clear to auscultation bilaterally Breasts: normal appearance, no masses or tenderness Heart: regular rate and rhythm Abdomen: soft, non-tender; bowel sounds normal; no masses,  no organomegaly Extremities: extremities normal, atraumatic, no cyanosis or edema Skin: Skin color, texture, turgor normal. No rashes or lesions Lymph nodes: Cervical, supraclavicular, and axillary nodes normal. No abnormal inguinal nodes palpated Neurologic: Grossly normal   Pelvic: External genitalia:  no lesions              Urethra:  normal appearing urethra with no masses, tenderness or lesions              Bartholins and Skenes: normal                 Vagina: normal appearing vagina with normal color  and no discharge, no lesions              Cervix: no lesions              Pap taken: Yes.   Bimanual Exam:  Uterus:  normal size, contour, position, consistency, mobility, non-tender              Adnexa: no mass, fullness, tenderness               Rectovaginal: Confirms               Anus:  normal sphincter tone, no lesions  Chaperone, Ina Homes, CMA, was present for exam.  Assessment/Plan: 1. Well woman exam with routine gynecological exam - Pap smear and HR HPV obtained today - Mammogram scheduled 10/3 - Colonoscopy 2014.  Pt aware this is due - Bone mineral density will be ordered for next year - lab work done with PCP -  vaccines reviewed/updated  2. Cervical cancer screening - Cytology - PAP( [facility])  3. Essential hypertension - on valsartan/HCTZ  4. Postmenopausal - options for treatment of vaginal dryness discussed   5. Mass of lower inner quadrant of right breast - MM 3D DIAGNOSTIC MAMMOGRAM BILATERAL BREAST; Future - [place] LIMITED ULTRASOUND INCLUDING AXILLA RIGHT BREAST; Future

## 2022-12-05 ENCOUNTER — Ambulatory Visit: Payer: 59

## 2022-12-06 ENCOUNTER — Other Ambulatory Visit: Payer: Self-pay | Admitting: Family Medicine

## 2022-12-06 DIAGNOSIS — Z1212 Encounter for screening for malignant neoplasm of rectum: Secondary | ICD-10-CM

## 2022-12-06 DIAGNOSIS — Z1211 Encounter for screening for malignant neoplasm of colon: Secondary | ICD-10-CM

## 2022-12-09 LAB — CYTOLOGY - PAP
Comment: NEGATIVE
Diagnosis: NEGATIVE
High risk HPV: NEGATIVE

## 2022-12-11 ENCOUNTER — Ambulatory Visit (HOSPITAL_BASED_OUTPATIENT_CLINIC_OR_DEPARTMENT_OTHER): Payer: 59

## 2022-12-17 ENCOUNTER — Ambulatory Visit
Admission: RE | Admit: 2022-12-17 | Discharge: 2022-12-17 | Disposition: A | Discharge status: Home / Self Care | Payer: 59 | Source: Ambulatory Visit | Attending: Obstetrics & Gynecology | Admitting: Obstetrics & Gynecology

## 2022-12-17 DIAGNOSIS — N6314 Unspecified lump in the right breast, lower inner quadrant: Secondary | ICD-10-CM

## 2022-12-17 DIAGNOSIS — N6459 Other signs and symptoms in breast: Secondary | ICD-10-CM | POA: Diagnosis not present

## 2023-03-12 ENCOUNTER — Encounter (HOSPITAL_BASED_OUTPATIENT_CLINIC_OR_DEPARTMENT_OTHER): Payer: Self-pay | Admitting: *Deleted

## 2023-03-20 ENCOUNTER — Other Ambulatory Visit (HOSPITAL_COMMUNITY): Payer: Self-pay

## 2023-10-17 ENCOUNTER — Encounter (HOSPITAL_BASED_OUTPATIENT_CLINIC_OR_DEPARTMENT_OTHER): Payer: Self-pay | Admitting: Family Medicine

## 2023-10-17 NOTE — Progress Notes (Signed)
Patient rescheduled.  This encounter was created in error - please disregard. 

## 2023-10-21 ENCOUNTER — Other Ambulatory Visit (HOSPITAL_BASED_OUTPATIENT_CLINIC_OR_DEPARTMENT_OTHER): Payer: Self-pay | Admitting: Obstetrics & Gynecology

## 2023-10-21 ENCOUNTER — Encounter (HOSPITAL_BASED_OUTPATIENT_CLINIC_OR_DEPARTMENT_OTHER): Payer: Self-pay | Admitting: Obstetrics & Gynecology

## 2023-10-21 DIAGNOSIS — E2839 Other primary ovarian failure: Secondary | ICD-10-CM

## 2023-10-21 DIAGNOSIS — Z1231 Encounter for screening mammogram for malignant neoplasm of breast: Secondary | ICD-10-CM

## 2023-10-31 ENCOUNTER — Ambulatory Visit (INDEPENDENT_AMBULATORY_CARE_PROVIDER_SITE_OTHER): Payer: Self-pay | Admitting: Family Medicine

## 2023-10-31 ENCOUNTER — Other Ambulatory Visit (HOSPITAL_COMMUNITY): Payer: Self-pay

## 2023-10-31 ENCOUNTER — Encounter (HOSPITAL_BASED_OUTPATIENT_CLINIC_OR_DEPARTMENT_OTHER): Payer: Self-pay | Admitting: Family Medicine

## 2023-10-31 VITALS — BP 140/90 | HR 73 | Temp 98.3°F | Ht 65.0 in | Wt 196.0 lb

## 2023-10-31 DIAGNOSIS — I10 Essential (primary) hypertension: Secondary | ICD-10-CM | POA: Diagnosis not present

## 2023-10-31 DIAGNOSIS — Z0000 Encounter for general adult medical examination without abnormal findings: Secondary | ICD-10-CM | POA: Diagnosis not present

## 2023-10-31 DIAGNOSIS — R7303 Prediabetes: Secondary | ICD-10-CM

## 2023-10-31 DIAGNOSIS — E785 Hyperlipidemia, unspecified: Secondary | ICD-10-CM

## 2023-10-31 DIAGNOSIS — Z1211 Encounter for screening for malignant neoplasm of colon: Secondary | ICD-10-CM | POA: Diagnosis not present

## 2023-10-31 MED ORDER — ROSUVASTATIN CALCIUM 5 MG PO TABS
5.0000 mg | ORAL_TABLET | Freq: Every day | ORAL | 3 refills | Status: AC
  Filled 2023-10-31: qty 90, 90d supply, fill #0
  Filled 2024-02-02: qty 90, 90d supply, fill #1

## 2023-10-31 MED ORDER — VALSARTAN-HYDROCHLOROTHIAZIDE 320-25 MG PO TABS
1.0000 | ORAL_TABLET | Freq: Every day | ORAL | 3 refills | Status: AC
  Filled 2023-10-31: qty 90, 90d supply, fill #0
  Filled 2024-02-02: qty 90, 90d supply, fill #1

## 2023-10-31 NOTE — Progress Notes (Signed)
 Subjective:   Darlene Shelton Dec 28, 1960  10/31/2023   CC: Chief Complaint  Patient presents with   Acute Visit    Patient in room #10 and alone. Patient state she here to follow up with her annually physical for her insurance.    HPI: Darlene Shelton is a 63 y.o. female who presents for a routine health maintenance exam.  Labs collected at time of visit.   HYPERLIPIDEMIA: Darlene Shelton presents for the medical management of hyperlipidemia.  Patient's current HLD regimen is: Crestor  5mg  Patient is  currently taking prescribed medications for HLD. Reports inconsistencies with remembering to take her medication regularly.  Denies myalgias.  Lab Results  Component Value Date   CHOL 193 10/24/2022   HDL 57 10/24/2022   LDLCALC 116 (H) 10/24/2022   TRIG 112 10/24/2022   CHOLHDL 3.4 10/24/2022     HYPERTENSION: Darlene Shelton presents for the medical management of hypertension.  Patient's current hypertension medication regimen is: Valsartan - hydrochlorothiazide  320-25mg   Patient is  currently taking prescribed medications for HTN. Reports inconsistencies with remembering to take her medication regularly.  Patient is not regularly keeping a check on BP at home.  Adhering to low sodium diet: yes Denies headache, dizziness, CP, SHOB, vision changes.   BP Readings from Last 3 Encounters:  10/31/23 (!) 140/90  10/17/23 (!) 158/98  12/03/22 (!) 161/90    HEALTH SCREENINGS: - Vision Screening: not applicable - Dental Visits: Recommended - Pap smear: up to date - Breast Exam: Declined - STD Screening: Declined - Mammogram (40+): Due in Oct; patient to schedule   - Colonoscopy (45+): Ordered today  - Bone Density (65+ or under 65 with predisposing conditions): Placed by OBGYN; will call imaging to schedule   - Lung CA screening with low-dose CT:  Not applicable Adults age 16-80 who are current cigarette smokers or quit within the last 15 years. Must have 20 pack  year history.   Depression and Anxiety Screen done today and results listed below:     12/03/2022    8:22 AM 12/05/2020    8:48 AM  Depression screen PHQ 2/9  Decreased Interest 0 1  Down, Depressed, Hopeless 0 1  PHQ - 2 Score 0 2  Altered sleeping  0  Tired, decreased energy  1  Change in appetite  1  Feeling bad or failure about yourself   1  Trouble concentrating  1  Moving slowly or fidgety/restless  0  Suicidal thoughts  0  PHQ-9 Score  6  Difficult doing work/chores  Somewhat difficult      12/05/2020    8:49 AM  GAD 7 : Generalized Anxiety Score  Nervous, Anxious, on Edge 1  Control/stop worrying 1  Worry too much - different things 1  Trouble relaxing 1  Restless 1  Easily annoyed or irritable 1  Afraid - awful might happen 1  Total GAD 7 Score 7  Anxiety Difficulty Somewhat difficult    IMMUNIZATIONS: - Tdap: Tetanus vaccination status reviewed: last tetanus booster within 10 years. - HPV: Not applicable - Influenza: Postponed to flu season - Pneumovax: Not applicable - Prevnar 20: Not applicable - Shingrix (50+): Refused   Past medical history, surgical history, medications, allergies, family history and social history reviewed with patient today and changes made to appropriate areas of the chart.   Past Medical History:  Diagnosis Date   Abnormal Pap smear    h/o LEEP 12/11   Anxiety  Elevated lipids    Fibroids    Hypertension     Past Surgical History:  Procedure Laterality Date   DILATION AND CURETTAGE OF UTERUS  1992   LEEP  02/2010   negative margins   TONSILLECTOMY      No current outpatient medications on file prior to visit.   No current facility-administered medications on file prior to visit.    No Known Allergies   Social History   Socioeconomic History   Marital status: Married    Spouse name: Not on file   Number of children: Not on file   Years of education: Not on file   Highest education level: 12th grade   Occupational History   Not on file  Tobacco Use   Smoking status: Never   Smokeless tobacco: Never  Vaping Use   Vaping status: Never Used  Substance and Sexual Activity   Alcohol use: No   Drug use: No   Sexual activity: Yes    Partners: Male    Birth control/protection: Post-menopausal  Other Topics Concern   Not on file  Social History Narrative   Not on file   Social Drivers of Health   Financial Resource Strain: Low Risk  (10/27/2023)   Overall Financial Resource Strain (CARDIA)    Difficulty of Paying Living Expenses: Not hard at all  Food Insecurity: No Food Insecurity (10/27/2023)   Hunger Vital Sign    Worried About Running Out of Food in the Last Year: Never true    Ran Out of Food in the Last Year: Never true  Transportation Needs: No Transportation Needs (10/27/2023)   PRAPARE - Administrator, Civil Service (Medical): No    Lack of Transportation (Non-Medical): No  Physical Activity: Unknown (10/27/2023)   Exercise Vital Sign    Days of Exercise per Week: Patient declined    Minutes of Exercise per Session: Not on file  Stress: No Stress Concern Present (10/27/2023)   [employer] of Occupational Health - Occupational Stress Questionnaire    Feeling of Stress: Not at all  Social Connections: Unknown (10/27/2023)   Social Connection and Isolation Panel    Frequency of Communication with Friends and Family: Patient declined    Frequency of Social Gatherings with Friends and Family: Patient declined    Attends Religious Services: Patient declined    Database administrator or Organizations: No    Attends Engineer, structural: Not on file    Marital Status: Married  Catering manager Violence: Not on file   Social History   Tobacco Use  Smoking Status Never  Smokeless Tobacco Never   Social History   Substance and Sexual Activity  Alcohol Use No    Family History  Problem Relation Age of Onset   Hypertension Mother     Hypertension Father    Hearing loss Father    Hypertension Sister    Diabetes Maternal Grandfather    Stroke Paternal Grandmother    Heart attack Paternal Grandmother        and strokes   Heart disease Paternal Grandfather    Colon cancer Neg Hx      ROS: Denies fever, fatigue, unexplained weight loss/gain, chest pain, SHOB, and palpitations. Denies neurological deficits, gastrointestinal or genitourinary complaints, and skin changes.   Objective:   Today's Vitals   10/31/23 1034 10/31/23 1104  BP: (!) 138/98 (!) 140/90  Pulse: 73   Temp: 98.3 F (36.8 C)   SpO2: 99%  Weight: 196 lb (88.9 kg)   Height: 5' 5 (1.651 m)     GENERAL APPEARANCE: Well-appearing, in NAD. Well nourished.  SKIN: Pink, warm and dry. Turgor normal. No rash, lesion, ulceration, or ecchymoses. Hair evenly distributed.  HEENT: HEAD: Normocephalic.  EYES: PERRLA. EOMI. Lids intact w/o defect. Sclera white, Conjunctiva pink w/o exudate.  EARS: External ear w/o redness, swelling, masses or lesions. EAC clear. TM's intact, translucent w/o bulging, appropriate landmarks visualized. Appropriate acuity to conversational tones.  NOSE: Septum midline w/o deformity. Nares patent, mucosa pink and non-inflamed w/o drainage. No sinus tenderness.  THROAT: Uvula midline. Oropharynx clear. Tonsils non-inflamed w/o exudate. Oral mucosa pink and moist.  NECK: Supple, Trachea midline. Full ROM w/o pain or tenderness. No lymphadenopathy. Thyroid non-tender w/o enlargement or palpable masses.  RESPIRATORY: Chest wall symmetrical w/o masses. Respirations even and non-labored. Breath sounds clear to auscultation bilaterally. No wheezes, rales, rhonchi, or crackles. CARDIAC: S1, S2 present, regular rate and rhythm. No gallops, murmurs, rubs, or clicks. PMI w/o lifts, heaves, or thrills. No carotid bruits. Capillary refill <2 seconds. Peripheral pulses 2+ bilaterally. GI: Abdomen soft w/o distention. Normoactive bowel sounds. No  palpable masses or tenderness. No guarding or rebound tenderness. Liver and spleen w/o tenderness or enlargement. No CVA tenderness.  MSK: Muscle tone and strength appropriate for age, w/o atrophy or abnormal movement.  EXTREMITIES: Active ROM intact, w/o tenderness, crepitus, or contracture. No obvious joint deformities or effusions. No clubbing, edema, or cyanosis.  NEUROLOGIC: CN's II-XII intact. Motor strength symmetrical with no obvious weakness. No sensory deficits. DTR's 2+ symmetric bilaterally. Steady, even gait.  PSYCH/MENTAL STATUS: Alert, oriented x 3. Cooperative, appropriate mood and affect.    Assessment & Plan:  1. Annual physical exam (Primary) Discussed preventative screenings, vaccines, and healthy lifestyle with patient. Declined Shingrix Vaccine today. Discussed patient to schedule mammogram in October and to schedule her DXA ordered by OBGYN.   - CBC with Differential/Platelet - Comprehensive metabolic panel with GFR - TSH  2. Screening for colon cancer Referral placed in 2024 and not scheduled by pt. New referral placed for patient today.  - Ambulatory referral to Gastroenterology  3. Hyperlipidemia, unspecified hyperlipidemia type Discussed importance of consistent medication therapy and will check LP with labs today. Recommend heart healthy diet and regular exercise.  - rosuvastatin  (CRESTOR ) 5 MG tablet; Take 1 tablet (5 mg total) by mouth at bedtime.  Dispense: 90 tablet; Refill: 3 - Lipid panel  4. Essential hypertension Discussed importance of taking BP medication regularly with patient and checking BP at home. Will check renal function and electrolytes today with labs. Request patient return in 4 weeks for BP check with taking med consistently to see if titration is needed. Pt agreeable.  - valsartan -hydrochlorothiazide  (DIOVAN -HCT) 320-25 MG tablet; Take 1 tablet by mouth daily.  Dispense: 90 tablet; Refill: 3 - Comprehensive metabolic panel with GFR  5.  Prediabetes Discussed diet control and regular exercise. Will check A1C with labs today.  - Hemoglobin A1c   Orders Placed This Encounter  Procedures   CBC with Differential/Platelet   Comprehensive metabolic panel with GFR   Lipid panel   TSH   Hemoglobin A1c   Ambulatory referral to Gastroenterology    Referral Priority:   Routine    Referral Type:   Consultation    Referral Reason:   Specialty Services Required    Number of Visits Requested:   1    PATIENT COUNSELING:  - Encouraged a healthy well-balanced diet. Patient may  adjust caloric intake to maintain or achieve ideal body weight. May reduce intake of dietary saturated fat and total fat and have adequate dietary potassium and calcium  preferably from fresh fruits, vegetables, and low-fat dairy products.   - Advised to avoid cigarette smoking. - Discussed with the patient that most people either abstain from alcohol or drink within safe limits (<=14/week and <=4 drinks/occasion for males, <=7/weeks and <= 3 drinks/occasion for females) and that the risk for alcohol disorders and other health effects rises proportionally with the number of drinks per week and how often a drinker exceeds daily limits. - Discussed cessation/primary prevention of drug use and availability of treatment for abuse.  - Discussed sexually transmitted diseases, avoidance of unintended pregnancy and contraceptive alternatives.  - Stressed the importance of regular exercise - Injury prevention: Discussed safety belts, safety helmets, smoke detector, smoking near bedding or upholstery.  - Dental health: Discussed importance of regular tooth brushing, flossing, and dental visits.   NEXT PREVENTATIVE PHYSICAL DUE IN 1 YEAR.  Return for 4 weeks BP check only; 1 year AE .  Patient to reach out to office if new, worrisome, or unresolved symptoms arise or if no improvement in patient's condition. Patient verbalized understanding and is agreeable to treatment  plan. All questions answered to patient's satisfaction.    Thersia Schuyler Stark, [place]

## 2023-10-31 NOTE — Patient Instructions (Signed)

## 2023-11-01 LAB — COMPREHENSIVE METABOLIC PANEL WITH GFR
ALT: 32 IU/L (ref 0–32)
AST: 32 IU/L (ref 0–40)
Albumin: 4.3 g/dL (ref 3.9–4.9)
Alkaline Phosphatase: 83 IU/L (ref 44–121)
BUN/Creatinine Ratio: 20 (ref 12–28)
BUN: 16 mg/dL (ref 8–27)
Bilirubin Total: 0.4 mg/dL (ref 0.0–1.2)
CO2: 24 mmol/L (ref 20–29)
Calcium: 9.6 mg/dL (ref 8.7–10.3)
Chloride: 104 mmol/L (ref 96–106)
Creatinine, Ser: 0.82 mg/dL (ref 0.57–1.00)
Globulin, Total: 2.5 g/dL (ref 1.5–4.5)
Glucose: 100 mg/dL — ABNORMAL HIGH (ref 70–99)
Potassium: 4.1 mmol/L (ref 3.5–5.2)
Sodium: 142 mmol/L (ref 134–144)
Total Protein: 6.8 g/dL (ref 6.0–8.5)
eGFR: 80 mL/min/1.73 (ref >59)

## 2023-11-01 LAB — LIPID PANEL
Chol/HDL Ratio: 3.7 ratio (ref 0.0–4.4)
Cholesterol, Total: 209 mg/dL — ABNORMAL HIGH (ref 100–199)
HDL: 56 mg/dL (ref >39)
LDL Chol Calc (NIH): 118 mg/dL — ABNORMAL HIGH (ref 0–99)
Triglycerides: 198 mg/dL — ABNORMAL HIGH (ref 0–149)
VLDL Cholesterol Cal: 35 mg/dL (ref 5–40)

## 2023-11-01 LAB — CBC WITH DIFFERENTIAL/PLATELET
Basophils Absolute: 0 x10E3/uL (ref 0.0–0.2)
Basos: 1 %
EOS (ABSOLUTE): 0.1 x10E3/uL (ref 0.0–0.4)
Eos: 1 %
Hematocrit: 39.1 % (ref 34.0–46.6)
Hemoglobin: 12.8 g/dL (ref 11.1–15.9)
Immature Grans (Abs): 0 x10E3/uL (ref 0.0–0.1)
Immature Granulocytes: 0 %
Lymphocytes Absolute: 2.4 x10E3/uL (ref 0.7–3.1)
Lymphs: 35 %
MCH: 30.2 pg (ref 26.6–33.0)
MCHC: 32.7 g/dL (ref 31.5–35.7)
MCV: 92 fL (ref 79–97)
Monocytes Absolute: 0.4 x10E3/uL (ref 0.1–0.9)
Monocytes: 5 %
Neutrophils Absolute: 3.9 x10E3/uL (ref 1.4–7.0)
Neutrophils: 58 %
Platelets: 224 x10E3/uL (ref 150–450)
RBC: 4.24 x10E6/uL (ref 3.77–5.28)
RDW: 12.5 % (ref 11.7–15.4)
WBC: 6.8 x10E3/uL (ref 3.4–10.8)

## 2023-11-01 LAB — HEMOGLOBIN A1C
Est. average glucose Bld gHb Est-mCnc: 137 mg/dL
Hgb A1c MFr Bld: 6.4 % — ABNORMAL HIGH (ref 4.8–5.6)

## 2023-11-01 LAB — TSH: TSH: 3.18 u[IU]/mL (ref 0.450–4.500)

## 2023-11-04 ENCOUNTER — Ambulatory Visit (HOSPITAL_BASED_OUTPATIENT_CLINIC_OR_DEPARTMENT_OTHER): Payer: Self-pay | Admitting: Family Medicine

## 2023-11-04 ENCOUNTER — Encounter (HOSPITAL_BASED_OUTPATIENT_CLINIC_OR_DEPARTMENT_OTHER): Payer: Self-pay | Admitting: Family Medicine

## 2023-11-04 DIAGNOSIS — E119 Type 2 diabetes mellitus without complications: Secondary | ICD-10-CM | POA: Insufficient documentation

## 2023-11-04 NOTE — Progress Notes (Signed)
 Please schedule patient for 6 month appt for DM follow up.   Hi Darlene Shelton,  Your electrolyte, kidney and liver function is stable. Your cholesterol is uncontrolled currently. Please take your Crestor  daily to help lower your risk of heart disease, heart attack and stroke. Your A1c, triglyceride and fasting glucose level have increased and are now in the Type 2 diabetes category. At this time, I would recommend starting medication such as Metformin , Mounjaro, or Ozempic for this as well as dietary changes with reducing your carbohydrates and sugar intake to reduce progression of type 2 diabetes. We will need to see you at least every 6 months for monitoring your diabetes as well. If you would like ot start medication, please reply to this message with your preference.

## 2023-11-06 ENCOUNTER — Other Ambulatory Visit (HOSPITAL_COMMUNITY): Payer: Self-pay

## 2023-11-06 MED ORDER — OMRON 3 SERIES BP MONITOR DEVI
0 refills | Status: AC
  Filled 2023-11-06: qty 1, 30d supply, fill #0

## 2023-11-28 ENCOUNTER — Ambulatory Visit (HOSPITAL_BASED_OUTPATIENT_CLINIC_OR_DEPARTMENT_OTHER)

## 2024-01-21 ENCOUNTER — Ambulatory Visit (HOSPITAL_BASED_OUTPATIENT_CLINIC_OR_DEPARTMENT_OTHER)
Admission: RE | Admit: 2024-01-21 | Discharge: 2024-01-21 | Disposition: A | Discharge status: Home / Self Care | Source: Ambulatory Visit | Attending: Obstetrics & Gynecology | Admitting: Obstetrics & Gynecology

## 2024-01-21 DIAGNOSIS — E2839 Other primary ovarian failure: Secondary | ICD-10-CM | POA: Diagnosis not present

## 2024-01-21 DIAGNOSIS — Z780 Asymptomatic menopausal state: Secondary | ICD-10-CM | POA: Diagnosis not present

## 2024-01-21 DIAGNOSIS — M85852 Other specified disorders of bone density and structure, left thigh: Secondary | ICD-10-CM | POA: Diagnosis not present

## 2024-01-22 ENCOUNTER — Ambulatory Visit (HOSPITAL_BASED_OUTPATIENT_CLINIC_OR_DEPARTMENT_OTHER): Payer: Self-pay | Admitting: Obstetrics & Gynecology

## 2024-02-12 ENCOUNTER — Other Ambulatory Visit (HOSPITAL_COMMUNITY): Payer: Self-pay

## 2024-05-04 ENCOUNTER — Ambulatory Visit (HOSPITAL_BASED_OUTPATIENT_CLINIC_OR_DEPARTMENT_OTHER): Admitting: Family Medicine

## 2024-11-01 ENCOUNTER — Encounter (HOSPITAL_BASED_OUTPATIENT_CLINIC_OR_DEPARTMENT_OTHER): Admitting: Family Medicine
